# Patient Record
Sex: Male | Born: 1975
Health system: Southern US, Community
[De-identification: ages and names within clinical notes are randomized; demographics above are authoritative.]

## PROBLEM LIST (undated history)

## (undated) DIAGNOSIS — K219 Gastro-esophageal reflux disease without esophagitis: Secondary | ICD-10-CM

## (undated) DIAGNOSIS — F1020 Alcohol dependence, uncomplicated: Secondary | ICD-10-CM

## (undated) HISTORY — PX: TONSILLECTOMY: SUR1361

## (undated) HISTORY — PX: CERVICAL FUSION: SHX112

---

## 2005-01-14 ENCOUNTER — Ambulatory Visit (HOSPITAL_COMMUNITY): Admission: RE | Admit: 2005-01-14 | Discharge: 2005-01-14 | Payer: Self-pay | Admitting: Family Medicine

## 2008-03-21 ENCOUNTER — Ambulatory Visit (HOSPITAL_COMMUNITY): Admission: RE | Admit: 2008-03-21 | Discharge: 2008-03-21 | Payer: Self-pay | Admitting: Family Medicine

## 2010-05-25 ENCOUNTER — Ambulatory Visit (HOSPITAL_COMMUNITY): Admission: RE | Admit: 2010-05-25 | Discharge: 2010-05-25 | Payer: Self-pay | Admitting: Internal Medicine

## 2013-08-04 ENCOUNTER — Emergency Department (HOSPITAL_COMMUNITY)
Admission: EM | Admit: 2013-08-04 | Discharge: 2013-08-05 | Disposition: A | Discharge status: Home / Self Care | Payer: 59 | Attending: Emergency Medicine | Admitting: Emergency Medicine

## 2013-08-04 ENCOUNTER — Emergency Department (HOSPITAL_COMMUNITY): Payer: 59

## 2013-08-04 ENCOUNTER — Encounter (HOSPITAL_COMMUNITY): Payer: Self-pay | Admitting: Emergency Medicine

## 2013-08-04 DIAGNOSIS — Y9289 Other specified places as the place of occurrence of the external cause: Secondary | ICD-10-CM | POA: Insufficient documentation

## 2013-08-04 DIAGNOSIS — Y9339 Activity, other involving climbing, rappelling and jumping off: Secondary | ICD-10-CM | POA: Insufficient documentation

## 2013-08-04 DIAGNOSIS — S82899A Other fracture of unspecified lower leg, initial encounter for closed fracture: Secondary | ICD-10-CM | POA: Insufficient documentation

## 2013-08-04 DIAGNOSIS — S82892A Other fracture of left lower leg, initial encounter for closed fracture: Secondary | ICD-10-CM

## 2013-08-04 DIAGNOSIS — X500XXA Overexertion from strenuous movement or load, initial encounter: Secondary | ICD-10-CM | POA: Insufficient documentation

## 2013-08-04 DIAGNOSIS — S82839A Other fracture of upper and lower end of unspecified fibula, initial encounter for closed fracture: Secondary | ICD-10-CM

## 2013-08-04 NOTE — ED Provider Notes (Signed)
CSN: [id number]     Arrival date & time 08/04/13  2106 History  This chart was scribed for non-physician practitioner Antony Madura, PA-C working with Rolan Bucco, MD by Leone Payor, ED Scribe. This patient was seen in room WTR5/WTR5 and the patient's care was started at 2106.    Chief Complaint  Patient presents with  . Ankle Pain    The history is provided by the patient. No language interpreter was used.    HPI Comments: Antonio Allen is a 37 y.o. male who presents to the Emergency Department complaining of sudden onset, constant, non-radiating, unchanged left ankle pain that began earlier today. Pt states we was playing with his children when he jumped and landed on the pavement. He states his ankle gave out and he heard a snap. He describes the pain as pressure and discomfort. He has not taken any OTC pain medication PTA. He states hanging his foot down worsens the pain. Rest and elevation eases the pain. He denies numbness.    History reviewed. No pertinent past medical history. Past Surgical History  Procedure Laterality Date  . Tonsillectomy     History reviewed. No pertinent family history. History  Substance Use Topics  . Smoking status: Never Smoker   . Smokeless tobacco: Not on file  . Alcohol Use: Yes    Review of Systems  Musculoskeletal: Positive for arthralgias (left ankle pain).  Neurological: Negative for numbness.  All other systems reviewed and are negative.    Allergies  Review of patient's allergies indicates no known allergies.  Home Medications   Current Outpatient Rx  Name  Route  Sig  Dispense  Refill  . ibuprofen (ADVIL,MOTRIN) 200 MG tablet   Oral   Take 200 mg by mouth every 6 (six) hours as needed for pain.         Marland Kitchen oxyCODONE-acetaminophen (PERCOCET/ROXICET) 5-325 MG per tablet   Oral   Take 1-2 tablets by mouth every 6 (six) hours as needed for pain.   17 tablet   0    BP 125/68  Pulse 88  Temp(Src) 98 F (36.7 C) (Oral)   Resp 16  SpO2 100%  Physical Exam  Nursing note and vitals reviewed. Constitutional: He is oriented to person, place, and time. He appears well-developed and well-nourished. No distress.  HENT:  Head: Normocephalic and atraumatic.  Eyes: Conjunctivae and EOM are normal. No scleral icterus.  Neck: Normal range of motion.  Cardiovascular: Normal rate, regular rhythm and normal pulses.   Pulses:      Dorsalis pedis pulses are 2+ on the right side, and 2+ on the left side.       Posterior tibial pulses are 2+ on the right side, and 2+ on the left side.  Pulmonary/Chest: Effort normal. No respiratory distress.  Musculoskeletal: He exhibits tenderness.       Left ankle: He exhibits swelling and ecchymosis. He exhibits no deformity and normal pulse. Tenderness. Medial malleolus tenderness found. Achilles tendon normal.  Swelling and tenderness to palpation surrounding medial malleolus and extending on to the dorsal proximal left foot.   Neurological: He is alert and oriented to person, place, and time. He has normal reflexes. He displays normal reflexes. No sensory deficit.  DTR normal and symmetric.  Skin: Skin is warm and dry. No rash noted. He is not diaphoretic. No erythema. No pallor.  Psychiatric: He has a normal mood and affect. His behavior is normal.    ED Course  Procedures  DIAGNOSTIC STUDIES: Oxygen Saturation is 100% on RA, normal by my interpretation.    COORDINATION OF CARE: 10:11 PM Will order XRAY of left foot/ankle. Discussed treatment plan with pt at bedside and pt agreed to plan.    Labs Review Labs Reviewed - No data to display Imaging Review Dg Ankle Complete Left  08/04/2013   CLINICAL DATA:  Ankle pain  EXAM: LEFT ANKLE COMPLETE - 3+ VIEW  COMPARISON:  None.  FINDINGS: Oblique fracture through the distal fibula, in the coronal plane. The distal margin of the fracture is 3 cm above the talar dome. There is widening of the medial clear space, without medial  fracture. Posterior malleolar fracture with superior displacement of the fragment, up to 1 cm. The anterior ankle joint is widened.  Premature degeneration of the subtalar joint, likely posttraumatic. Prominent beaking of the anterior talus. No visible coalition.  IMPRESSION: 1. Distal fibular diaphysis fracture with medial ankle ligamentous injury and ankle mortise disruption. 2. Displaced posterior malleolar fracture.   Electronically Signed   By: Tiburcio Pea M.D.   On: 08/04/2013 22:37    EKG Interpretation   None       MDM   1. Fracture of distal fibula   2. Malleolar fracture, left, closed, initial encounter    Patient is a 37 year old male who presents for left ankle pain with onset this evening. Patient as well and nontoxic appearing, hemodynamically stable, and afebrile. Patient neurovascularly intact on physical exam, but with significant swelling to his left ankle; no obvious deformities appreciated. There is also no pallor, pulselessness, poikilothermia, or paresthesias. Xray significant for distal alar diaphysis fracture with medial ankle ligamentous injury and ankle mortise disruption area there is also a displaced posterior malleolar fracture. Have consulted with orthopedic surgery who recommended stable compression dressing with gauze and Kerlex followed by a posterior stirrup splint; will see for further evaluation as an outpatient. Patient instructed to be nonweightbearing and to use crutches when ambulating. RICE advised. Patient stable and appropriate for d/c with orthopedic follow up. Percocet prescribed for pain control. Patient agreeable to plan with no unaddressed concerns.  I personally performed the services described in this documentation, which was scribed in my presence. The recorded information has been reviewed and is accurate.    Antony Madura, PA-C 08/08/13 1725

## 2013-08-04 NOTE — ED Notes (Signed)
Bed: [facility] Expected date:  Expected time:  Means of arrival:  Comments: closed

## 2013-08-04 NOTE — ED Notes (Signed)
Moved pt to room 11 with wife

## 2013-08-04 NOTE — ED Notes (Signed)
Pt arrived to ED with a complaint of left ankle pain.  Pt was playing with children when he jumped and landed on the pavement when his ankle gave way and he heard a snap.  Pt can move his toes but is unable to move his foot laterally or up and down.

## 2013-08-05 MED ORDER — OXYCODONE-ACETAMINOPHEN 5-325 MG PO TABS: 1.0000 | ORAL_TABLET | Freq: Four times a day (QID) | ORAL | Status: DC | PRN

## 2013-08-06 ENCOUNTER — Encounter (HOSPITAL_COMMUNITY): Payer: Self-pay | Admitting: Pharmacy Technician

## 2013-08-07 ENCOUNTER — Encounter (HOSPITAL_COMMUNITY): Payer: Self-pay | Admitting: *Deleted

## 2013-08-07 NOTE — H&P (Signed)
Antonio Allen is an 37 y.o. male.   Chief Complaint: left ankle pain HPI: Antonio Allen is a 37 year old male who presented to Dr. Jeannetta Ellis office with the chief complaint of left ankle pain. He reports that he has been having pain in the left ankle since an injury on Saturday. He reports that he was throwing ball with his son at home when he jumped for the ball. When he landed, he rolled his ankle. He denies a history of significant injuries to the left ankle. He is not experiencing numbness or tingling in the legs. He has been resting and icing the ankle since the injury. AP, lateral, and oblique views were obtained initially at the ED on Saturday showing that he has a left ankle fracture.   Past Medical History GERD  Past Surgical History  Procedure Laterality Date  . Tonsillectomy     Family History Father: cancer  Social History:  reports that he has never smoked. He does not have any smokeless tobacco history on file. He reports that he drinks alcohol. He reports that he does not use illicit drugs.  Allergies: No Known Allergies   Current outpatient prescriptions: famotidine (PEPCID) 20 MG tablet, Take 20 mg by mouth daily., Disp: , Rfl: ;   ibuprofen (ADVIL,MOTRIN) 200 MG tablet, Take 200 mg by mouth every 6 (six) hours as needed for pain., Disp: , Rfl: ;   oxyCODONE-acetaminophen (PERCOCET/ROXICET) 5-325 MG per tablet, Take 1 tablet by mouth every 4 (four) hours as needed for pain., Disp: , Rfl:    Review of Systems  Constitutional: Negative.   HENT: Negative.   Eyes: Negative.   Respiratory: Negative.   Cardiovascular: Negative.   Gastrointestinal: Positive for heartburn. Negative for nausea, vomiting, abdominal pain, diarrhea, constipation, blood in stool and melena.  Genitourinary: Negative.   Musculoskeletal: Positive for falls and joint pain. Negative for back pain, myalgias and neck pain.       Left ankle pain  Skin: Negative.   Neurological: Negative.    Endo/Heme/Allergies: Negative.   Psychiatric/Behavioral: Negative.     Weight: 260 lb Height: 73 in Body Surface Area: 2.46 m Body Mass Index: 34.3 kg/m  Physical Exam  Constitutional: He is oriented to person, place, and time. He appears well-developed and well-nourished. No distress.  HENT:  Head: Normocephalic and atraumatic.  Right Ear: External ear normal.  Left Ear: External ear normal.  Nose: Nose normal.  Mouth/Throat: Oropharynx is clear and moist.  Eyes: EOM are normal.  Neck: Normal range of motion. Neck supple.  Cardiovascular: Normal rate, regular rhythm, normal heart sounds and intact distal pulses.   No murmur heard. Respiratory: Effort normal and breath sounds normal. No respiratory distress. He has no wheezes.  GI: Soft. Bowel sounds are normal. He exhibits no distension. There is no tenderness.  Musculoskeletal:       Right knee: Normal.       Left knee: Normal.       Right ankle: Normal.       Left ankle: He exhibits decreased range of motion, swelling and laceration. Tenderness. Lateral malleolus, medial malleolus and AITFL tenderness found. Achilles tendon normal.       Right lower leg: He exhibits no tenderness and no swelling.       Left lower leg: He exhibits swelling. He exhibits no tenderness and no edema.  Neurological: He is alert and oriented to person, place, and time. He has normal strength and normal reflexes. No sensory deficit.  Skin: No rash noted. He is not diaphoretic. No erythema.  Psychiatric: He has a normal mood and affect. His behavior is normal.     Assessment/Plan AP, lateral, and oblique views of the left ankle show distal fibular diaphysis fracture with medial ankle ligamentous injury and ankle mortise disruption and displaced posterior malleolar fracture. He needs an ORIF left ankle. This will be an overnight stay. Risks and benefits of the surgery discussed with the patient by Dr. Darrelyn Hillock.   Antonio Allen  Antonio Allen 08/07/2013, 2:07 PM

## 2013-08-08 ENCOUNTER — Encounter (HOSPITAL_COMMUNITY): Payer: 59 | Admitting: Anesthesiology

## 2013-08-08 ENCOUNTER — Ambulatory Visit (HOSPITAL_COMMUNITY): Payer: 59 | Admitting: Anesthesiology

## 2013-08-08 ENCOUNTER — Ambulatory Visit (HOSPITAL_COMMUNITY): Payer: 59

## 2013-08-08 ENCOUNTER — Encounter (HOSPITAL_COMMUNITY): Payer: Self-pay

## 2013-08-08 ENCOUNTER — Observation Stay (HOSPITAL_COMMUNITY)
Admission: RE | Admit: 2013-08-08 | Discharge: 2013-08-09 | Disposition: A | Discharge status: Home / Self Care | Payer: 59 | Source: Ambulatory Visit | Attending: Orthopedic Surgery | Admitting: Orthopedic Surgery

## 2013-08-08 ENCOUNTER — Encounter (HOSPITAL_COMMUNITY)
Admission: RE | Disposition: A | Discharge status: Home / Self Care | Payer: Self-pay | Source: Ambulatory Visit | Attending: Orthopedic Surgery

## 2013-08-08 DIAGNOSIS — K219 Gastro-esophageal reflux disease without esophagitis: Secondary | ICD-10-CM | POA: Insufficient documentation

## 2013-08-08 DIAGNOSIS — Y9379 Activity, other specified sports and athletics: Secondary | ICD-10-CM | POA: Insufficient documentation

## 2013-08-08 DIAGNOSIS — E669 Obesity, unspecified: Secondary | ICD-10-CM | POA: Insufficient documentation

## 2013-08-08 DIAGNOSIS — X500XXA Overexertion from strenuous movement or load, initial encounter: Secondary | ICD-10-CM | POA: Insufficient documentation

## 2013-08-08 DIAGNOSIS — S82842A Displaced bimalleolar fracture of left lower leg, initial encounter for closed fracture: Secondary | ICD-10-CM

## 2013-08-08 DIAGNOSIS — Y92009 Unspecified place in unspecified non-institutional (private) residence as the place of occurrence of the external cause: Secondary | ICD-10-CM | POA: Insufficient documentation

## 2013-08-08 DIAGNOSIS — S82843A Displaced bimalleolar fracture of unspecified lower leg, initial encounter for closed fracture: Principal | ICD-10-CM | POA: Insufficient documentation

## 2013-08-08 HISTORY — PX: ORIF ANKLE FRACTURE: SHX5408

## 2013-08-08 HISTORY — DX: Gastro-esophageal reflux disease without esophagitis: K21.9

## 2013-08-08 LAB — URINALYSIS, ROUTINE W REFLEX MICROSCOPIC
Bilirubin Urine: NEGATIVE
Glucose, UA: NEGATIVE mg/dL
Hgb urine dipstick: NEGATIVE
Ketones, ur: NEGATIVE mg/dL
Leukocytes, UA: NEGATIVE
Nitrite: NEGATIVE
Protein, ur: NEGATIVE mg/dL
Specific Gravity, Urine: 1.021 (ref 1.005–1.030)
Urobilinogen, UA: 0.2 mg/dL (ref 0.0–1.0)
pH: 7 (ref 5.0–8.0)

## 2013-08-08 LAB — CBC
HCT: 38.2 % — ABNORMAL LOW (ref 39.0–52.0)
Hemoglobin: 13.2 g/dL (ref 13.0–17.0)
MCH: 31.7 pg (ref 26.0–34.0)
MCHC: 34.6 g/dL (ref 30.0–36.0)
MCV: 91.8 fL (ref 78.0–100.0)
Platelets: 298 10*3/uL (ref 150–400)
RDW: 12.6 % (ref 11.5–15.5)

## 2013-08-08 LAB — PROTIME-INR
INR: 0.94 (ref 0.00–1.49)
Prothrombin Time: 12.4 seconds (ref 11.6–15.2)

## 2013-08-08 LAB — COMPREHENSIVE METABOLIC PANEL
ALT: 29 U/L (ref 0–53)
AST: 22 U/L (ref 0–37)
Albumin: 3.7 g/dL (ref 3.5–5.2)
Alkaline Phosphatase: 128 U/L — ABNORMAL HIGH (ref 39–117)
BUN: 13 mg/dL (ref 6–23)
CO2: 29 mEq/L (ref 19–32)
Calcium: 9.5 mg/dL (ref 8.4–10.5)
Chloride: 100 mEq/L (ref 96–112)
Creatinine, Ser: 0.99 mg/dL (ref 0.50–1.35)
GFR calc Af Amer: >90 mL/min (ref >90)
GFR calc non Af Amer: >90 mL/min (ref >90)
Glucose, Bld: 104 mg/dL — ABNORMAL HIGH (ref 70–99)
Potassium: 4.5 mEq/L (ref 3.5–5.1)
Sodium: 138 mEq/L (ref 135–145)
Total Bilirubin: 0.5 mg/dL (ref 0.3–1.2)
Total Protein: 7.3 g/dL (ref 6.0–8.3)

## 2013-08-08 LAB — APTT: aPTT: 28 seconds (ref 24–37)

## 2013-08-08 SURGERY — OPEN REDUCTION INTERNAL FIXATION (ORIF) ANKLE FRACTURE
Anesthesia: General | Site: Ankle | Laterality: Left | Wound class: Clean

## 2013-08-08 MED ORDER — CEFAZOLIN SODIUM-DEXTROSE 2-3 GM-% IV SOLR: 2.0000 g | INTRAVENOUS | Status: DC

## 2013-08-08 MED ORDER — FLEET ENEMA 7-19 GM/118ML RE ENEM: 1.0000 | ENEMA | Freq: Once | RECTAL | Status: AC | PRN

## 2013-08-08 MED ORDER — THROMBIN 5000 UNITS EX SOLR
CUTANEOUS | Status: AC
  Filled 2013-08-08: qty 10000

## 2013-08-08 MED ORDER — METHOCARBAMOL 500 MG PO TABS
500.0000 mg | ORAL_TABLET | Freq: Four times a day (QID) | ORAL | Status: DC | PRN
  Administered 2013-08-08 – 2013-08-09 (×2): 500 mg via ORAL
  Filled 2013-08-08 (×2): qty 1

## 2013-08-08 MED ORDER — PROPOFOL 10 MG/ML IV BOLUS
INTRAVENOUS | Status: DC | PRN
  Administered 2013-08-08: 200 mg via INTRAVENOUS

## 2013-08-08 MED ORDER — FAMOTIDINE 20 MG PO TABS
20.0000 mg | ORAL_TABLET | Freq: Every day | ORAL | Status: DC
  Filled 2013-08-08: qty 1

## 2013-08-08 MED ORDER — OXYCODONE-ACETAMINOPHEN 5-325 MG PO TABS
1.0000 | ORAL_TABLET | ORAL | Status: DC | PRN
  Administered 2013-08-08 – 2013-08-09 (×4): 2 via ORAL
  Filled 2013-08-08 (×4): qty 2

## 2013-08-08 MED ORDER — HYDROMORPHONE HCL PF 1 MG/ML IJ SOLN
INTRAMUSCULAR | Status: AC
  Filled 2013-08-08: qty 1

## 2013-08-08 MED ORDER — HYDROCODONE-ACETAMINOPHEN 5-325 MG PO TABS: 1.0000 | ORAL_TABLET | ORAL | Status: DC | PRN

## 2013-08-08 MED ORDER — ONDANSETRON HCL 4 MG PO TABS: 4.0000 mg | ORAL_TABLET | Freq: Four times a day (QID) | ORAL | Status: DC | PRN

## 2013-08-08 MED ORDER — LACTATED RINGERS IV SOLN
INTRAVENOUS | Status: DC
Start: 2013-08-08 — End: 2013-08-08
  Administered 2013-08-08: 1000 mL via INTRAVENOUS

## 2013-08-08 MED ORDER — BACITRACIN ZINC 500 UNIT/GM EX OINT
TOPICAL_OINTMENT | CUTANEOUS | Status: DC | PRN
  Administered 2013-08-08: 1 via TOPICAL

## 2013-08-08 MED ORDER — ONDANSETRON HCL 4 MG/2ML IJ SOLN: 4.0000 mg | Freq: Four times a day (QID) | INTRAMUSCULAR | Status: DC | PRN

## 2013-08-08 MED ORDER — PROMETHAZINE HCL 25 MG/ML IJ SOLN: 6.2500 mg | INTRAMUSCULAR | Status: DC | PRN

## 2013-08-08 MED ORDER — INFLUENZA VAC SPLIT QUAD 0.5 ML IM SUSP
0.5000 mL | INTRAMUSCULAR | Status: AC
  Administered 2013-08-09: 0.5 mL via INTRAMUSCULAR
  Filled 2013-08-08 (×2): qty 0.5

## 2013-08-08 MED ORDER — SODIUM CHLORIDE 0.9 % IR SOLN
Status: DC | PRN
  Administered 2013-08-08: 14:00:00

## 2013-08-08 MED ORDER — FENTANYL CITRATE 0.05 MG/ML IJ SOLN
INTRAMUSCULAR | Status: DC | PRN
  Administered 2013-08-08 (×2): 100 ug via INTRAVENOUS
  Administered 2013-08-08: 50 ug via INTRAVENOUS

## 2013-08-08 MED ORDER — DEXAMETHASONE SODIUM PHOSPHATE 10 MG/ML IJ SOLN
INTRAMUSCULAR | Status: DC | PRN
  Administered 2013-08-08: 10 mg via INTRAVENOUS

## 2013-08-08 MED ORDER — HYDROMORPHONE HCL PF 1 MG/ML IJ SOLN
0.5000 mg | INTRAMUSCULAR | Status: DC | PRN
  Administered 2013-08-08 – 2013-08-09 (×3): 1 mg via INTRAVENOUS
  Filled 2013-08-08 (×3): qty 1

## 2013-08-08 MED ORDER — SUCCINYLCHOLINE CHLORIDE 20 MG/ML IJ SOLN
INTRAMUSCULAR | Status: DC | PRN
  Administered 2013-08-08: 100 mg via INTRAVENOUS

## 2013-08-08 MED ORDER — MIDAZOLAM HCL 2 MG/2ML IJ SOLN
INTRAMUSCULAR | Status: DC | PRN
  Administered 2013-08-08: 2 mg via INTRAVENOUS

## 2013-08-08 MED ORDER — POLYETHYLENE GLYCOL 3350 17 G PO PACK
17.0000 g | PACK | Freq: Every day | ORAL | Status: DC | PRN
  Administered 2013-08-08 – 2013-08-09 (×2): 17 g via ORAL

## 2013-08-08 MED ORDER — HYDROMORPHONE HCL PF 1 MG/ML IJ SOLN
0.2500 mg | INTRAMUSCULAR | Status: DC | PRN
  Administered 2013-08-08 (×3): 0.5 mg via INTRAVENOUS

## 2013-08-08 MED ORDER — ENOXAPARIN SODIUM 30 MG/0.3ML ~~LOC~~ SOLN
30.0000 mg | SUBCUTANEOUS | Status: DC
  Administered 2013-08-09: 07:00:00 30 mg via SUBCUTANEOUS
  Filled 2013-08-08 (×2): qty 0.3

## 2013-08-08 MED ORDER — LACTATED RINGERS IV SOLN
INTRAVENOUS | Status: DC
  Administered 2013-08-09: 04:00:00 via INTRAVENOUS

## 2013-08-08 MED ORDER — DEXTROSE 5 % IV SOLN
500.0000 mg | Freq: Four times a day (QID) | INTRAVENOUS | Status: DC | PRN
  Administered 2013-08-08: 500 mg via INTRAVENOUS
  Filled 2013-08-08: qty 5

## 2013-08-08 MED ORDER — KETAMINE HCL 10 MG/ML IJ SOLN
INTRAMUSCULAR | Status: DC | PRN
  Administered 2013-08-08: 20 mg via INTRAVENOUS

## 2013-08-08 MED ORDER — BUPIVACAINE LIPOSOME 1.3 % IJ SUSP
20.0000 mL | Freq: Once | INTRAMUSCULAR | Status: AC
  Administered 2013-08-08: 15 mL
  Filled 2013-08-08: qty 20

## 2013-08-08 MED ORDER — BISACODYL 10 MG RE SUPP: 10.0000 mg | Freq: Every day | RECTAL | Status: DC | PRN

## 2013-08-08 MED ORDER — CEFAZOLIN SODIUM 1-5 GM-% IV SOLN
1.0000 g | Freq: Four times a day (QID) | INTRAVENOUS | Status: AC
  Administered 2013-08-08 – 2013-08-09 (×3): 1 g via INTRAVENOUS
  Filled 2013-08-08 (×3): qty 50

## 2013-08-08 MED ORDER — DEXTROSE 5 % IV SOLN
3.0000 g | INTRAVENOUS | Status: AC
  Administered 2013-08-08: 3 g via INTRAVENOUS
  Filled 2013-08-08: qty 3000

## 2013-08-08 MED ORDER — LIDOCAINE HCL (CARDIAC) 20 MG/ML IV SOLN
INTRAVENOUS | Status: DC | PRN
  Administered 2013-08-08: 80 mg via INTRAVENOUS

## 2013-08-08 MED ORDER — BACITRACIN-NEOMYCIN-POLYMYXIN 400-5-5000 EX OINT
TOPICAL_OINTMENT | CUTANEOUS | Status: AC
  Filled 2013-08-08: qty 1

## 2013-08-08 SURGICAL SUPPLY — 55 items
BAG SPEC THK2 15X12 ZIP CLS (MISCELLANEOUS) ×1
BAG ZIPLOCK 12X15 (MISCELLANEOUS) ×2 IMPLANT
BANDAGE ELASTIC 4 VELCRO ST LF (GAUZE/BANDAGES/DRESSINGS) ×2 IMPLANT
BANDAGE ELASTIC 6 VELCRO ST LF (GAUZE/BANDAGES/DRESSINGS) ×2 IMPLANT
BANDAGE GAUZE ELAST BULKY 4 IN (GAUZE/BANDAGES/DRESSINGS) ×2 IMPLANT
BIT DRILL 2.5X2.75 QC CALB (BIT) ×1 IMPLANT
BIT DRILL 3.5X5.5 QC CALB (BIT) ×1 IMPLANT
BNDG PLASTER X FAST 3X3 WHT LF (CAST SUPPLIES) ×1 IMPLANT
BNDG PLSTR 9X3 FST ST WHT (CAST SUPPLIES) ×1
CLOTH BEACON ORANGE TIMEOUT ST (SAFETY) ×1 IMPLANT
CUFF TOURN SGL QUICK 34 (TOURNIQUET CUFF)
CUFF TRNQT CYL 34X4X40X1 (TOURNIQUET CUFF) ×1 IMPLANT
DRAPE C-ARM 42X120 X-RAY (DRAPES) ×2 IMPLANT
DRAPE C-ARMOR (DRAPES) ×1 IMPLANT
DRAPE U-SHAPE 47X51 STRL (DRAPES) ×2 IMPLANT
DRSG ADAPTIC 3X8 NADH LF (GAUZE/BANDAGES/DRESSINGS) ×2 IMPLANT
DRSG PAD ABDOMINAL 8X10 ST (GAUZE/BANDAGES/DRESSINGS) ×3 IMPLANT
DURAPREP 26ML APPLICATOR (WOUND CARE) ×2 IMPLANT
ELECT REM PT RETURN 9FT ADLT (ELECTROSURGICAL) ×2
ELECTRODE REM PT RTRN 9FT ADLT (ELECTROSURGICAL) ×1 IMPLANT
GLOVE BIOGEL PI IND STRL 8 (GLOVE) ×1 IMPLANT
GLOVE BIOGEL PI INDICATOR 8 (GLOVE) ×1
GLOVE ECLIPSE 8.0 STRL XLNG CF (GLOVE) ×4 IMPLANT
GOWN PREVENTION PLUS LG XLONG (DISPOSABLE) ×6 IMPLANT
GOWN STRL REIN XL XLG (GOWN DISPOSABLE) ×4 IMPLANT
MANIFOLD NEPTUNE II (INSTRUMENTS) ×2 IMPLANT
PACK LOWER EXTREMITY WL (CUSTOM PROCEDURE TRAY) ×2 IMPLANT
PAD CAST 4YDX4 CTTN HI CHSV (CAST SUPPLIES) ×2 IMPLANT
PADDING CAST COTTON 4X4 STRL (CAST SUPPLIES) ×2
PADDING CAST COTTON 6X4 STRL (CAST SUPPLIES) ×4 IMPLANT
PLATE ACE 100DEG 8HOLE (Plate) ×1 IMPLANT
POSITIONER SURGICAL ARM (MISCELLANEOUS) ×1 IMPLANT
SCOTCHCAST PLUS 3X4 WHITE (CAST SUPPLIES) ×3 IMPLANT
SCREW CORTICAL 3.5MM  12MM (Screw) ×1 IMPLANT
SCREW CORTICAL 3.5MM  16MM (Screw) ×1 IMPLANT
SCREW CORTICAL 3.5MM  20MM (Screw) ×1 IMPLANT
SCREW CORTICAL 3.5MM  60MM (Screw) ×1 IMPLANT
SCREW CORTICAL 3.5MM 12MM (Screw) IMPLANT
SCREW CORTICAL 3.5MM 14MM (Screw) ×3 IMPLANT
SCREW CORTICAL 3.5MM 16MM (Screw) IMPLANT
SCREW CORTICAL 3.5MM 20MM (Screw) IMPLANT
SCREW CORTICAL 3.5MM 22MM (Screw) ×1 IMPLANT
SCREW CORTICAL 3.5MM 60MM (Screw) IMPLANT
SCREW NLOCK CANC HEX 4X16 (Screw) ×2 IMPLANT
SPONGE GAUZE 4X4 12PLY (GAUZE/BANDAGES/DRESSINGS) ×4 IMPLANT
SPONGE LAP 4X18 X RAY DECT (DISPOSABLE) ×2 IMPLANT
STAPLER VISISTAT 35W (STAPLE) ×1 IMPLANT
SUT ETHIBOND NAB CT1 #1 30IN (SUTURE) ×1 IMPLANT
SUT VIC AB 0 CT1 27 (SUTURE)
SUT VIC AB 0 CT1 27XBRD ANTBC (SUTURE) ×1 IMPLANT
SUT VIC AB 1 CT1 27 (SUTURE) ×4
SUT VIC AB 1 CT1 27XBRD ANTBC (SUTURE) IMPLANT
SUT VIC AB 2-0 CT1 27 (SUTURE) ×2
SUT VIC AB 2-0 CT1 TAPERPNT 27 (SUTURE) ×1 IMPLANT
TOWEL OR 17X26 10 PK STRL BLUE (TOWEL DISPOSABLE) ×4 IMPLANT

## 2013-08-08 NOTE — Interval H&P Note (Signed)
History and Physical Interval Note:  08/08/2013 12:13 PM  Antonio Allen  has presented today for surgery, with the diagnosis of left ankle fracture  The various methods of treatment have been discussed with the patient and family. After consideration of risks, benefits and other options for treatment, the patient has consented to  Procedure(s): OPEN REDUCTION INTERNAL FIXATION (ORIF) LEFT ANKLE FRACTURE (Left) as a surgical intervention .  The patient's history has been reviewed, patient examined, no change in status, stable for surgery.  I have reviewed the patient's chart and labs.  Questions were answered to the patient's satisfaction.     Geetika Laborde A

## 2013-08-08 NOTE — Anesthesia Postprocedure Evaluation (Signed)
  Anesthesia Post-op Note  Patient: Antonio Allen  Procedure(s) Performed: Procedure(s) (LRB): OPEN REDUCTION INTERNAL FIXATION (ORIF) LEFT ANKLE FRACTURE (Left)  Patient Location: PACU  Anesthesia Type: General  Level of Consciousness: awake and alert   Airway and Oxygen Therapy: Patient Spontanous Breathing  Post-op Pain: mild  Post-op Assessment: Post-op Vital signs reviewed, Patient's Cardiovascular Status Stable, Respiratory Function Stable, Patent Airway and No signs of Nausea or vomiting  Last Vitals:  Filed Vitals:   08/08/13 1545  BP: 131/75  Pulse: 90  Temp:   Resp: 10    Post-op Vital Signs: stable   Complications: No apparent anesthesia complications

## 2013-08-08 NOTE — Brief Op Note (Signed)
08/08/2013  3:09 PM  PATIENT:  Antonio Allen  37 y.o. male  PRE-OPERATIVE DIAGNOSIS:  left ankle fracture,Bimalleolar Ankle Fracture with disruption of ankle Mortise.  POST-OPERATIVE DIAGNOSIS:  left ankle fracture,Bimalleolar Ankle Fracture wtht disruption of ankle Mortise  PROCEDURE:  Procedure(s): OPEN REDUCTION INTERNAL FIXATION (ORIF) LEFT ANKLE FRACTURE (Left),Bimalleolar Fracture,With Syndesmosis Screw  SURGEON:  Surgeon(s) and Role:    * Jacki Cones, MD - Primary  PHYSICIAN ASSISTANT:Amber Fidd[place] PA   ASSISTANTS: Dimitri Ped PA   ANESTHESIA:   general  EBL:  Total I/O In: 1100 [I.V.:1100] Out: -   BLOOD ADMINISTERED:none  DRAINS: none   LOCAL MEDICATIONS USED:  BUPIVICAINE 20cc of Exparel.   SPECIMEN:  No Specimen  DISPOSITION OF SPECIMEN:  N/A  COUNTS:  YES  TOURNIQUET:   Total Tourniquet Time Documented: Thigh (Left) - 75 minutes Total: Thigh (Left) - 75 minutes   DICTATION: .Other Dictation: Dictation Number [id number]  PLAN OF CARE: Admit for overnight observation  PATIENT DISPOSITION:  PACU - hemodynamically stable.   Delay start of Pharmacological VTE agent (>24hrs) due to surgical blood loss or risk of bleeding: yes

## 2013-08-08 NOTE — Transfer of Care (Signed)
Immediate Anesthesia Transfer of Care Note  Patient: Antonio Allen  Procedure(s) Performed: Procedure(s): OPEN REDUCTION INTERNAL FIXATION (ORIF) LEFT ANKLE FRACTURE (Left)  Patient Location: PACU  Anesthesia Type:General  Level of Consciousness: sedated  Airway & Oxygen Therapy: Patient Spontanous Breathing and Patient connected to face mask oxygen  Post-op Assessment: Report given to PACU RN and Post -op Vital signs reviewed and stable  Post vital signs: Reviewed and stable  Complications: No apparent anesthesia complications

## 2013-08-08 NOTE — Anesthesia Preprocedure Evaluation (Signed)
Anesthesia Evaluation  Patient identified by MRN, date of birth, ID band Patient awake    Reviewed: Allergy & Precautions, H&P , NPO status , Patient's Chart, lab work & pertinent test results  Airway Mallampati: II TM Distance: >3 FB Neck ROM: Full    Dental no notable dental hx.    Pulmonary neg pulmonary ROS,  breath sounds clear to auscultation  Pulmonary exam normal       Cardiovascular Exercise Tolerance: Good negative cardio ROS  Rhythm:Regular Rate:Normal     Neuro/Psych negative neurological ROS  negative psych ROS   GI/Hepatic Neg liver ROS, GERD-  Medicated,  Endo/Other  negative endocrine ROS  Renal/GU negative Renal ROS  negative genitourinary   Musculoskeletal negative musculoskeletal ROS (+)   Abdominal (+) + obese,   Peds negative pediatric ROS (+)  Hematology negative hematology ROS (+)   Anesthesia Other Findings   Reproductive/Obstetrics negative OB ROS                           Anesthesia Physical Anesthesia Plan  ASA: II  Anesthesia Plan: General   Post-op Pain Management:    Induction: Intravenous  Airway Management Planned: Oral ETT  Additional Equipment:   Intra-op Plan:   Post-operative Plan: Extubation in OR  Informed Consent: I have reviewed the patients History and Physical, chart, labs and discussed the procedure including the risks, benefits and alternatives for the proposed anesthesia with the patient or authorized representative who has indicated his/her understanding and acceptance.   Dental advisory given  Plan Discussed with: CRNA  Anesthesia Plan Comments:         Anesthesia Quick Evaluation

## 2013-08-09 ENCOUNTER — Encounter (HOSPITAL_COMMUNITY): Payer: Self-pay | Admitting: Orthopedic Surgery

## 2013-08-09 MED ORDER — PNEUMOCOCCAL VAC POLYVALENT 25 MCG/0.5ML IJ INJ: 0.5000 mL | INJECTION | INTRAMUSCULAR | Status: DC

## 2013-08-09 MED ORDER — ENOXAPARIN SODIUM 30 MG/0.3ML ~~LOC~~ SOLN: 30.0000 mg | SUBCUTANEOUS | Status: DC

## 2013-08-09 MED ORDER — HYDROMORPHONE HCL 4 MG PO TABS: 4.0000 mg | ORAL_TABLET | ORAL | Status: DC | PRN

## 2013-08-09 NOTE — ED Provider Notes (Signed)
Medical screening examination/treatment/procedure(s) were performed by non-physician practitioner and as supervising physician I was immediately available for consultation/collaboration.  EKG Interpretation   None         Araya Roel, MD 08/09/13 0656 

## 2013-08-09 NOTE — Care Management Note (Signed)
    Page 1 of 1   08/09/2013     11:48:44 AM   CARE MANAGEMENT NOTE 08/09/2013  Patient:  Antonio Allen, Antonio Allen   Account Number:  [id number]  Date Initiated:  08/09/2013  Documentation initiated by:  Colleen Can  Subjective/Objective Assessment:   DX LEFT ANKLE FRACTURE-DISPLACED; ORIF     Action/Plan:   CM spoke with patient and spouse. Plans are for patient to return to his home where spouse will be caregiver. He alrwady has crutches. There are no HH needs.   Anticipated DC Date:  08/09/2013   Anticipated DC Plan:  HOME/SELF CARE      DC Planning Services  CM consult      Choice offered to / List presented to:             Status of service:  Completed, signed off Medicare Important Message given?   (If response is "NO", the following Medicare IM given date fields will be blank) Date Medicare IM given:   Date Additional Medicare IM given:    Discharge Disposition:  HOME/SELF CARE  Per UR Regulation:  Reviewed for med. necessity/level of care/duration of stay  If discussed at Long Length of Stay Meetings, dates discussed:    Comments:

## 2013-08-09 NOTE — Op Note (Signed)
NAMESEGER, Antonio Allen NO.:  [id number]  MEDICAL RECORD NO.:  [id number]  LOCATION:  1617                         FACILITY:  [facility]  PHYSICIAN:  Georges Lynch. Netta Fodge, M.D.DATE OF BIRTH:  1976/05/02  DATE OF PROCEDURE: DATE OF DISCHARGE:                              OPERATIVE REPORT   SURGEON:  Lei Dower A. Darrelyn Hillock, M.D.  OPERATIVE ASSISTANT:  [employer] PA.  Note, the posterior fragment of the posterior malleolus was less than 25% of the articular surface.  There was some comminution in the fragment as well.  The fibula was significantly displaced.  The ankle mortise was widened as well.  PREOPERATIVE DIAGNOSIS:  Closed displaced bimalleolar ankle fracture on the left.  POSTOPERATIVE DIAGNOSIS:  Closed displaced bimalleolar ankle fracture on the left.  OPERATION:  Open reduction internal fixation of the fibular shaft utilizing a plate with screws and also I utilized 1 syndesmosis screw across the syndesmosis of the distal tibia and fibula as well.  PROCEDURE IN DETAIL:  Under general anesthesia, routine orthopedic prep followed by a sterile prep of the left leg was carried out.  He had 2 g of IV Ancef preop.  At this time, the appropriate time-out was carried out first, also marked the appropriate left leg in the holding area. The patient was positioned in a supine position.  Tourniquet was applied.  It was elevated at 325 mmHg.  Note, great care was taken not to injure the sural nerve, or the superficial peroneal nerve.  An incision was made over the lateral aspect of the fibula on the left. Bleeders were identified and cauterized.  Once again, great care was taken not to disturb the underlying sensory nerve.  I went right down directly onto the fibula.  I explored the peroneal tendon then the fibular fracture.  Also noted some widening of the ankle mortise.  At this time, we utilized a bone clamp to clamp a 1/3 fibular tibial plate on and then x-ray was  taken to make sure we were distal enough with the plate we worked.  At this time, I utilized 2 screws for fixation of the proximal fibular plate.  I then utilized 1 cancellous screw distally, followed by second cancellous screw distally to hold the fibular plate in place.  I then utilized a transverse syndesmosis screw.  C-arm was used to verify position of the screw.  We measured the screw to be a 60 mm in length screw.  At this time, remaining screws then were placed into the fibula unit.  These were nonlocking screws.  X-ray was taken to make sure we had the appropriate screw lengths.  We had an excellent reduction of the ankle mortise.  The lateral view was taken with the foot dorsiflexed and I felt we had good approximation of that small posterior malleolar fracture fragment.  After this was done, we thoroughly irrigated out the wound site, closed the wound site in usual fashion.  We injected 15 mL of Exparel into the fracture site, and then into the soft tissue.  I then closed the skin with metal staples.  Sterile Neosporin dressing was applied.  Note, we put a good bundle dressing underneath  and then loosely applied short leg cast.  The patient did have 2 g of IV Ancef.  He will be admitted overnight.          ______________________________ Georges Lynch. Darrelyn Hillock, M.D.     RAG/MEDQ  D:  08/08/2013  T:  08/09/2013  Job:  [id number]

## 2013-08-09 NOTE — Progress Notes (Signed)
Subjective: 1 Day Post-Op Procedure(s) (LRB): OPEN REDUCTION INTERNAL FIXATION (ORIF) LEFT ANKLE FRACTURE (Left) Patient reports pain as 4 on 0-10 scale. Doing Well today. Circulation Intact in Foot.   Objective: Vital signs in last 24 hours: Temp:  [97.5 F (36.4 C)-98.8 F (37.1 C)] 98.8 F (37.1 C) (10/23 0516) Pulse Rate:  [73-100] 93 (10/23 0516) Resp:  [10-18] 16 (10/23 0516) BP: (124-143)/(62-81) 129/77 mmHg (10/23 0516) SpO2:  [97 %-100 %] 98 % (10/23 0200) Weight:  [123.605 kg (272 lb 8 oz)] 123.605 kg (272 lb 8 oz) (10/22 0921)  Intake/Output from previous day: 10/22 0701 - 10/23 0700 In: 3366.7 [P.O.:720; I.V.:2646.7] Out: 2875 [Urine:2875] Intake/Output this shift:     Recent Labs  08/08/13 1700  HGB 13.2    Recent Labs  08/08/13 1700  WBC 6.8  RBC 4.16*  HCT 38.2*  PLT 298    Recent Labs  08/08/13 1000  NA 138  K 4.5  CL 100  CO2 29  BUN 13  CREATININE 0.99  GLUCOSE 104*  CALCIUM 9.5    Recent Labs  08/08/13 1000  INR 0.94    Neurovascular intact  Assessment/Plan: 1 Day Post-Op Procedure(s) (LRB): OPEN REDUCTION INTERNAL FIXATION (ORIF) LEFT ANKLE FRACTURE (Left) Discharge home with home health  Fredi Hurtado A 08/09/2013, 7:41 AM

## 2013-08-09 NOTE — Discharge Summary (Signed)
Physician Discharge Summary  Patient ID: Antonio Allen MRN: [id number] DOB/AGE: 01/17/1976 37 y.o.  Admit date: 08/08/2013 Discharge date: 08/09/2013  Admission Diagnoses:Closed BiMalleolar Ankle Fracture on the Left  Discharge Diagnoses: Closed BiMalleolar Ankle Fracture on the Left Active Problems:   Closed bimalleolar fracture of left ankle   Discharged Condition: good  Hospital Course: No Post-Op Problems  Consults: rehabilitation medicine  Significant Diagnostic Studies: Xrays  Treatments: antibiotics: Ancef and anticoagulation: Lovenox  Discharge Exam: Blood pressure 112/70, pulse 75, temperature 98.3 F (36.8 C), temperature source Oral, resp. rate 14, height 6\' 1"  (1.854 m), weight 123.605 kg (272 lb 8 oz), SpO2 99.00%. Extremities: Good circulation in Foot.  Disposition: 01-Home or Self Care  Discharge Orders   Future Orders Complete By Expires   Call MD / Call 911  As directed    Comments:     If you experience chest pain or shortness of breath, CALL 911 and be transported to the hospital emergency room.  If you develope a fever above 101 F, pus (white drainage) or increased drainage or redness at the wound, or calf pain, call your surgeon's office.   Constipation Prevention  As directed    Comments:     Drink plenty of fluids.  Prune juice may be helpful.  You may use a stool softener, such as Colace (over the counter) 100 mg twice a day.  Use MiraLax (over the counter) for constipation as needed.   Diet - low sodium heart healthy  As directed    Discharge instructions  As directed    Comments:     Ice and elevate lrft leg at home. Ambulate non-weight bearing with crutches.See Dr. Darrelyn Hillock next Tuesday.   Driving restrictions  As directed    Comments:     No driving for two weeks   Increase activity slowly as tolerated  As directed        Medication List    STOP taking these medications       aspirin 325 MG tablet     diphenhydrAMINE 25 MG tablet   Commonly known as:  BENADRYL     ibuprofen 200 MG tablet  Commonly known as:  ADVIL,MOTRIN     oxyCODONE-acetaminophen 5-325 MG per tablet  Commonly known as:  PERCOCET/ROXICET      TAKE these medications       enoxaparin 30 MG/0.3ML injection  Commonly known as:  LOVENOX  Inject 0.3 mLs (30 mg total) into the skin daily.     famotidine 20 MG tablet  Commonly known as:  PEPCID  Take 20 mg by mouth daily.     HYDROmorphone 4 MG tablet  Commonly known as:  DILAUDID  Take 1 tablet (4 mg total) by mouth every 4 (four) hours as needed for pain.         Signed: Jibreel Fedewa A 08/09/2013, 10:47 PM

## 2013-08-09 NOTE — Evaluation (Signed)
Physical Therapy Evaluation Patient Details Name: Antonio Allen MRN: [id number] DOB: 12-02-1975 Today's Date: 08/09/2013 Time:  -     PT Assessment / Plan / Recommendation History of Present Illness  ORIF L ankle  Clinical Impression  Pt doing well, has been using crutches since Saturday; stairs deferred    PT Assessment  Patent does not need any further PT services    Follow Up Recommendations  No PT follow up    Does the patient have the potential to tolerate intense rehabilitation      Barriers to Discharge        Equipment Recommendations       Recommendations for Other Services     Frequency      Precautions / Restrictions Restrictions Weight Bearing Restrictions: Yes LLE Weight Bearing: Non weight bearing   Pertinent Vitals/Pain No c/o pain      Mobility  Bed Mobility Bed Mobility: Supine to Sit Supine to Sit: 5: Supervision Details for Bed Mobility Assistance: for safety Transfers Transfers: Sit to Stand;Stand to Sit Sit to Stand: 5: Supervision;6: Modified independent (Device/Increase time) Stand to Sit: 5: Supervision;6: Modified independent (Device/Increase time)    Exercises     PT Diagnosis:    PT Problem List:   PT Treatment Interventions:       PT Goals(Current goals can be found in the care plan section)    Visit Information  Last PT Received On: 08/09/13 Assistance Needed: +1 History of Present Illness: ORIF L ankle       Prior Functioning  Home Living Family/patient expects to be discharged to:: Private residence Living Arrangements: Spouse/significant other;Children Type of Home: House Home Access: Stairs to enter Secretary/administrator of Steps: 2 Home Layout: One level Home Equipment: Crutches Additional Comments: pt on crutches since Saturday Prior Function Level of Independence: Independent Communication Communication: No difficulties    Cognition  Cognition Arousal/Alertness: Awake/alert Behavior During  Therapy: WFL for tasks assessed/performed Overall Cognitive Status: Within Functional Limits for tasks assessed    Extremity/Trunk Assessment Upper Extremity Assessment Upper Extremity Assessment: Overall WFL for tasks assessed Lower Extremity Assessment Lower Extremity Assessment: LLE deficits/detail LLE Deficits / Details: knee and hip AROM grossly WFL LLE: Unable to fully assess due to immobilization   Balance Static Standing Balance Static Standing - Balance Support: During functional activity Static Standing - Level of Assistance: 6: Modified independent (Device/Increase time)  End of Session PT - End of Session Equipment Utilized During Treatment: Gait belt Activity Tolerance: Patient tolerated treatment well Patient left: in chair;with call bell/phone within reach;with family/visitor present  GP     [facility] 08/09/2013, 11:29 AM

## 2014-12-03 ENCOUNTER — Ambulatory Visit (INDEPENDENT_AMBULATORY_CARE_PROVIDER_SITE_OTHER): Payer: 59 | Admitting: Physician Assistant

## 2014-12-03 VITALS — BP 116/78 | HR 84 | Temp 97.7°F | Resp 16 | Ht 73.5 in | Wt 291.0 lb

## 2014-12-03 DIAGNOSIS — H6983 Other specified disorders of Eustachian tube, bilateral: Secondary | ICD-10-CM

## 2014-12-03 DIAGNOSIS — H6121 Impacted cerumen, right ear: Secondary | ICD-10-CM

## 2014-12-03 MED ORDER — TRIAMCINOLONE ACETONIDE 55 MCG/ACT NA AERO: 2.0000 | INHALATION_SPRAY | Freq: Every day | NASAL | Status: DC

## 2014-12-03 NOTE — Progress Notes (Signed)
   Subjective:    Patient ID: Antonio Allen, male    DOB: July 26, 1976, 38 y.o.   MRN: [id number]  HPI Pt presents to clinic with intermittent hearing loss and his equilibrium has felt off over the last about 2 weeks.  It feels like he is up on a mountain and his ears are clogged.  He has not had problems with cold symptoms recently and he does not currently have nasal congestion.  He has tubes as a child but as an adult he has not had problems with his ears.  He is having intermittent tinnitus and intermittent though not associated dizzy but not vertigo.  He has done nothing for his ears but take a zyrtec pill once that did not help.  Review of Systems  Constitutional: Negative for fever and chills.  HENT: Positive for hearing loss. Negative for congestion, ear discharge, ear pain and sinus pressure.   Respiratory: Negative for cough.        Objective:   Physical Exam  Constitutional: He is oriented to person, place, and time. He appears well-developed and well-nourished.  BP 116/78 mmHg  Pulse 84  Temp(Src) 97.7 F (36.5 C) (Oral)  Resp 16  Ht 6' 1.5" (1.867 m)  Wt 291 lb (131.997 kg)  BMI 37.87 kg/m2  SpO2 97%   HENT:  Head: Normocephalic and atraumatic.  Right Ear: Hearing and external ear normal. A foreign body (cerumen impaction - removed with lavage) is present. Tympanic membrane is scarred and retracted. Tympanic membrane is not erythematous.  Left Ear: Hearing, external ear and ear canal normal. Tympanic membrane is scarred and retracted. Tympanic membrane is not erythematous.  Nose: Mucosal edema (red, swollen) present.  Mouth/Throat: Uvula is midline, oropharynx is clear and moist and mucous membranes are normal.  Cardiovascular: Normal rate, regular rhythm and normal heart sounds.   No murmur heard. Pulmonary/Chest: Effort normal and breath sounds normal.  Neurological: He is alert and oriented to person, place, and time.  Skin: Skin is warm and dry.  Psychiatric: He  has a normal mood and affect. His behavior is normal. Judgment and thought content normal.       Assessment & Plan:  ETD (eustachian tube dysfunction), bilateral - Plan: triamcinolone (NASACORT AQ) 55 MCG/ACT AERO nasal inhaler  Cerumen impaction, right   Symptomatic treatment - d/w pt that this make take a few weeks along with Zyrtec D - he will f/u if he has problems.  Windell Hummingbird PA-C  [facility] 12/03/2014 5:33 PM

## 2014-12-03 NOTE — Progress Notes (Signed)
  Medical screening examination/treatment/procedure(s) were performed by non-physician practitioner and as supervising physician I was immediately available for consultation/collaboration.     

## 2014-12-03 NOTE — Patient Instructions (Signed)
Zyrtec D to help with congestion Nasal spray to help with nasal congestion that you may not be aware of

## 2015-04-10 ENCOUNTER — Other Ambulatory Visit (INDEPENDENT_AMBULATORY_CARE_PROVIDER_SITE_OTHER): Payer: Self-pay | Admitting: Otolaryngology

## 2015-04-10 DIAGNOSIS — H918X9 Other specified hearing loss, unspecified ear: Secondary | ICD-10-CM

## 2015-04-11 ENCOUNTER — Other Ambulatory Visit (INDEPENDENT_AMBULATORY_CARE_PROVIDER_SITE_OTHER): Payer: Self-pay | Admitting: Otolaryngology

## 2015-04-11 DIAGNOSIS — H918X9 Other specified hearing loss, unspecified ear: Secondary | ICD-10-CM

## 2015-04-14 ENCOUNTER — Other Ambulatory Visit: Payer: 59

## 2015-04-23 ENCOUNTER — Inpatient Hospital Stay: Admission: RE | Admit: 2015-04-23 | Payer: 59 | Source: Ambulatory Visit

## 2015-04-23 ENCOUNTER — Other Ambulatory Visit: Payer: 59

## 2018-05-30 DIAGNOSIS — D229 Melanocytic nevi, unspecified: Secondary | ICD-10-CM

## 2018-05-30 HISTORY — DX: Melanocytic nevi, unspecified: D22.9

## 2020-01-28 ENCOUNTER — Ambulatory Visit: Admission: EM | Admit: 2020-01-28 | Discharge: 2020-01-28 | Discharge status: Against Medical Advice | Payer: 59

## 2020-01-28 ENCOUNTER — Ambulatory Visit
Admission: EM | Admit: 2020-01-28 | Discharge: 2020-01-28 | Disposition: A | Discharge status: Home / Self Care | Payer: No Typology Code available for payment source

## 2020-01-28 ENCOUNTER — Other Ambulatory Visit: Payer: Self-pay

## 2020-03-10 ENCOUNTER — Ambulatory Visit (INDEPENDENT_AMBULATORY_CARE_PROVIDER_SITE_OTHER): Payer: No Typology Code available for payment source | Admitting: Dermatology

## 2020-03-10 ENCOUNTER — Other Ambulatory Visit: Payer: Self-pay

## 2020-03-10 ENCOUNTER — Encounter: Payer: Self-pay | Admitting: Dermatology

## 2020-03-10 DIAGNOSIS — L821 Other seborrheic keratosis: Secondary | ICD-10-CM

## 2020-03-10 DIAGNOSIS — I781 Nevus, non-neoplastic: Secondary | ICD-10-CM

## 2020-03-10 DIAGNOSIS — D229 Melanocytic nevi, unspecified: Secondary | ICD-10-CM

## 2020-03-10 DIAGNOSIS — Z1283 Encounter for screening for malignant neoplasm of skin: Secondary | ICD-10-CM | POA: Diagnosis not present

## 2020-03-10 DIAGNOSIS — D485 Neoplasm of uncertain behavior of skin: Secondary | ICD-10-CM | POA: Diagnosis not present

## 2020-03-10 DIAGNOSIS — D225 Melanocytic nevi of trunk: Secondary | ICD-10-CM | POA: Diagnosis not present

## 2020-03-10 NOTE — Patient Instructions (Signed)

## 2020-03-10 NOTE — Progress Notes (Signed)
WIFE WITH PATIENT

## 2020-03-16 ENCOUNTER — Encounter: Payer: Self-pay | Admitting: Dermatology

## 2020-03-16 NOTE — Progress Notes (Signed)
   Follow-Up Visit   Subjective  Antonio Allen is a 44 y.o. male who presents for the following: Skin Problem (X MONTHS RIGHT CHEEK).  Growth Location: Right cheek Duration: Avril months Quality: Larger Associated Signs/Symptoms: Needs Modifying Factors:  Severity:  Timing: Context: Street of atypical moles  The following portions of the chart were reviewed this encounter and updated as appropriate: Tobacco  Allergies  Meds  Problems  Med Hx  Surg Hx  Fam Hx      Objective  Well appearing patient in no apparent distress; mood and affect are within normal limits.  All skin waist up examined.   Assessment & Plan  Neoplasm of uncertain behavior of skin Right Parotid Area  Skin / nail biopsy Type of biopsy: tangential   Informed consent: discussed and consent obtained   Timeout: patient name, date of birth, surgical site, and procedure verified   Procedure prep:  Patient was prepped and draped in usual sterile fashion Prep type:  Chlorhexidine Anesthesia: the lesion was anesthetized in a standard fashion   Anesthetic:  1% lidocaine w/ epinephrine 1-100,000 local infiltration Instrument used: flexible razor blade   Hemostasis achieved with: ferric subsulfate   Outcome: patient tolerated procedure well   Post-procedure details: wound care instructions given    Specimen 1 - Surgical pathology Differential Diagnosis: BCC SCC Check Margins: No  Base curetted and cauterized  Nevus Mid Back  Seborrheic keratosis (5) Right Elbow - Posterior; Right Forearm - Posterior; Right Lower Back; Left Temple; Right Temple  Reassure  Spider angioma (2) Right Breast; Left Abdomen (side) - Upper  Reassure  Skin cancer screening performed today.

## 2020-04-05 ENCOUNTER — Encounter (HOSPITAL_COMMUNITY): Payer: Self-pay | Admitting: Emergency Medicine

## 2020-04-05 ENCOUNTER — Emergency Department (HOSPITAL_COMMUNITY)
Admission: EM | Admit: 2020-04-05 | Discharge: 2020-04-05 | Disposition: A | Discharge status: Home / Self Care | Payer: No Typology Code available for payment source | Attending: Emergency Medicine | Admitting: Emergency Medicine

## 2020-04-05 ENCOUNTER — Emergency Department (HOSPITAL_COMMUNITY): Payer: No Typology Code available for payment source

## 2020-04-05 ENCOUNTER — Other Ambulatory Visit: Payer: Self-pay

## 2020-04-05 DIAGNOSIS — Y92410 Unspecified street and highway as the place of occurrence of the external cause: Secondary | ICD-10-CM | POA: Insufficient documentation

## 2020-04-05 DIAGNOSIS — Y939 Activity, unspecified: Secondary | ICD-10-CM | POA: Insufficient documentation

## 2020-04-05 DIAGNOSIS — Z23 Encounter for immunization: Secondary | ICD-10-CM | POA: Diagnosis not present

## 2020-04-05 DIAGNOSIS — S0181XA Laceration without foreign body of other part of head, initial encounter: Secondary | ICD-10-CM | POA: Diagnosis not present

## 2020-04-05 DIAGNOSIS — S0990XA Unspecified injury of head, initial encounter: Secondary | ICD-10-CM | POA: Diagnosis present

## 2020-04-05 DIAGNOSIS — Y999 Unspecified external cause status: Secondary | ICD-10-CM | POA: Diagnosis not present

## 2020-04-05 MED ORDER — LIDOCAINE-EPINEPHRINE (PF) 2 %-1:200000 IJ SOLN
20.0000 mL | Freq: Once | INTRAMUSCULAR | Status: DC
  Filled 2020-04-05: qty 20

## 2020-04-05 MED ORDER — TETANUS-DIPHTH-ACELL PERTUSSIS 5-2.5-18.5 LF-MCG/0.5 IM SUSP
0.5000 mL | Freq: Once | INTRAMUSCULAR | Status: AC
Start: 2020-04-05 — End: 2020-04-05
  Administered 2020-04-05: 0.5 mL via INTRAMUSCULAR
  Filled 2020-04-05: qty 0.5
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
## 2020-04-05 NOTE — Discharge Instructions (Signed)
Sutures removed in 5-7 days.  Return for new or worsening symptoms
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
## 2020-05-15 ENCOUNTER — Ambulatory Visit: Payer: No Typology Code available for payment source | Attending: Internal Medicine

## 2020-05-15 DIAGNOSIS — Z20822 Contact with and (suspected) exposure to covid-19: Secondary | ICD-10-CM

## 2020-05-16 LAB — NOVEL CORONAVIRUS, NAA: SARS-CoV-2, NAA: NOT DETECTED

## 2020-05-16 LAB — SARS-COV-2, NAA 2 DAY TAT

## 2020-09-05 ENCOUNTER — Other Ambulatory Visit (HOSPITAL_COMMUNITY): Payer: Self-pay | Admitting: Physician Assistant

## 2020-09-05 ENCOUNTER — Ambulatory Visit: Payer: Self-pay

## 2020-09-15 ENCOUNTER — Other Ambulatory Visit (HOSPITAL_COMMUNITY): Payer: Self-pay | Admitting: Physician Assistant

## 2020-09-17 ENCOUNTER — Other Ambulatory Visit (HOSPITAL_COMMUNITY): Payer: Self-pay | Admitting: Sports Medicine

## 2020-12-05 ENCOUNTER — Other Ambulatory Visit (HOSPITAL_COMMUNITY): Payer: Self-pay | Admitting: Student

## 2020-12-09 ENCOUNTER — Other Ambulatory Visit (HOSPITAL_COMMUNITY): Payer: Self-pay | Admitting: Neurological Surgery

## 2021-01-08 ENCOUNTER — Other Ambulatory Visit (HOSPITAL_COMMUNITY): Payer: Self-pay | Admitting: Neurological Surgery

## 2021-01-28 ENCOUNTER — Other Ambulatory Visit (HOSPITAL_COMMUNITY): Payer: Self-pay

## 2021-01-28 MED ORDER — TRAMADOL HCL 50 MG PO TABS
50.0000 mg | ORAL_TABLET | Freq: Four times a day (QID) | ORAL | 0 refills | Status: DC | PRN
  Filled 2021-01-28: qty 30, 7d supply, fill #0
  Filled 2021-02-23: qty 30, 8d supply, fill #0

## 2021-02-06 ENCOUNTER — Other Ambulatory Visit (HOSPITAL_COMMUNITY): Payer: Self-pay

## 2021-02-23 ENCOUNTER — Other Ambulatory Visit (HOSPITAL_COMMUNITY): Payer: Self-pay
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
## 2021-07-06 ENCOUNTER — Other Ambulatory Visit (HOSPITAL_BASED_OUTPATIENT_CLINIC_OR_DEPARTMENT_OTHER): Payer: Self-pay | Admitting: Sports Medicine

## 2021-07-06 ENCOUNTER — Other Ambulatory Visit (HOSPITAL_BASED_OUTPATIENT_CLINIC_OR_DEPARTMENT_OTHER): Payer: Self-pay

## 2021-07-07 ENCOUNTER — Other Ambulatory Visit (HOSPITAL_BASED_OUTPATIENT_CLINIC_OR_DEPARTMENT_OTHER): Payer: Self-pay

## 2021-07-07 MED ORDER — GABAPENTIN 300 MG PO CAPS
ORAL_CAPSULE | ORAL | 1 refills | Status: DC
  Filled 2021-07-07: qty 90, 30d supply, fill #0

## 2021-11-05 ENCOUNTER — Other Ambulatory Visit (HOSPITAL_COMMUNITY): Payer: Self-pay

## 2021-11-05 MED ORDER — METHYLPREDNISOLONE 4 MG PO TBPK
ORAL_TABLET | ORAL | 0 refills | Status: DC
  Filled 2021-11-05: qty 21, 6d supply, fill #0

## 2021-11-05 MED ORDER — AZITHROMYCIN 250 MG PO TABS
ORAL_TABLET | ORAL | 0 refills | Status: DC
  Filled 2021-11-05: qty 6, 5d supply, fill #0

## 2022-11-25 ENCOUNTER — Other Ambulatory Visit (HOSPITAL_COMMUNITY): Payer: Self-pay | Admitting: Family Medicine

## 2022-11-25 ENCOUNTER — Ambulatory Visit (HOSPITAL_COMMUNITY)
Admission: RE | Admit: 2022-11-25 | Discharge: 2022-11-25 | Disposition: A | Discharge status: Home / Self Care | Payer: BC Managed Care – PPO | Source: Ambulatory Visit | Attending: Family Medicine | Admitting: Family Medicine

## 2022-11-25 DIAGNOSIS — R059 Cough, unspecified: Secondary | ICD-10-CM

## 2022-11-25 DIAGNOSIS — J069 Acute upper respiratory infection, unspecified: Secondary | ICD-10-CM

## 2023-06-06 ENCOUNTER — Encounter (HOSPITAL_COMMUNITY): Payer: Self-pay | Admitting: *Deleted

## 2023-06-06 ENCOUNTER — Emergency Department (HOSPITAL_COMMUNITY): Payer: BC Managed Care – PPO

## 2023-06-06 ENCOUNTER — Inpatient Hospital Stay (HOSPITAL_COMMUNITY)
Admission: EM | Admit: 2023-06-06 | Discharge: 2023-06-09 | DRG: 378 | Disposition: A | Discharge status: Home / Self Care | Payer: BC Managed Care – PPO | Attending: Infectious Disease | Admitting: Infectious Disease

## 2023-06-06 ENCOUNTER — Other Ambulatory Visit: Payer: Self-pay

## 2023-06-06 DIAGNOSIS — Z7952 Long term (current) use of systemic steroids: Secondary | ICD-10-CM

## 2023-06-06 DIAGNOSIS — R011 Cardiac murmur, unspecified: Secondary | ICD-10-CM | POA: Diagnosis present

## 2023-06-06 DIAGNOSIS — I517 Cardiomegaly: Secondary | ICD-10-CM | POA: Diagnosis present

## 2023-06-06 DIAGNOSIS — Z79899 Other long term (current) drug therapy: Secondary | ICD-10-CM | POA: Diagnosis not present

## 2023-06-06 DIAGNOSIS — K922 Gastrointestinal hemorrhage, unspecified: Principal | ICD-10-CM

## 2023-06-06 DIAGNOSIS — F1020 Alcohol dependence, uncomplicated: Secondary | ICD-10-CM | POA: Diagnosis present

## 2023-06-06 DIAGNOSIS — D500 Iron deficiency anemia secondary to blood loss (chronic): Secondary | ICD-10-CM

## 2023-06-06 DIAGNOSIS — Z981 Arthrodesis status: Secondary | ICD-10-CM

## 2023-06-06 DIAGNOSIS — K648 Other hemorrhoids: Secondary | ICD-10-CM | POA: Diagnosis present

## 2023-06-06 DIAGNOSIS — K3189 Other diseases of stomach and duodenum: Secondary | ICD-10-CM | POA: Diagnosis present

## 2023-06-06 DIAGNOSIS — D649 Anemia, unspecified: Secondary | ICD-10-CM

## 2023-06-06 DIAGNOSIS — F1010 Alcohol abuse, uncomplicated: Secondary | ICD-10-CM | POA: Diagnosis not present

## 2023-06-06 DIAGNOSIS — K2971 Gastritis, unspecified, with bleeding: Secondary | ICD-10-CM | POA: Diagnosis not present

## 2023-06-06 DIAGNOSIS — E876 Hypokalemia: Secondary | ICD-10-CM | POA: Diagnosis present

## 2023-06-06 DIAGNOSIS — K7030 Alcoholic cirrhosis of liver without ascites: Secondary | ICD-10-CM | POA: Diagnosis present

## 2023-06-06 DIAGNOSIS — D62 Acute posthemorrhagic anemia: Secondary | ICD-10-CM | POA: Diagnosis present

## 2023-06-06 DIAGNOSIS — K2980 Duodenitis without bleeding: Secondary | ICD-10-CM | POA: Diagnosis present

## 2023-06-06 DIAGNOSIS — E871 Hypo-osmolality and hyponatremia: Secondary | ICD-10-CM | POA: Diagnosis present

## 2023-06-06 DIAGNOSIS — K2921 Alcoholic gastritis with bleeding: Secondary | ICD-10-CM | POA: Diagnosis present

## 2023-06-06 DIAGNOSIS — E669 Obesity, unspecified: Secondary | ICD-10-CM | POA: Diagnosis present

## 2023-06-06 DIAGNOSIS — K219 Gastro-esophageal reflux disease without esophagitis: Secondary | ICD-10-CM | POA: Diagnosis present

## 2023-06-06 DIAGNOSIS — Z6836 Body mass index (BMI) 36.0-36.9, adult: Secondary | ICD-10-CM

## 2023-06-06 DIAGNOSIS — K5731 Diverticulosis of large intestine without perforation or abscess with bleeding: Secondary | ICD-10-CM | POA: Diagnosis present

## 2023-06-06 DIAGNOSIS — K7010 Alcoholic hepatitis without ascites: Secondary | ICD-10-CM | POA: Diagnosis present

## 2023-06-06 DIAGNOSIS — K921 Melena: Secondary | ICD-10-CM

## 2023-06-06 DIAGNOSIS — Z91030 Bee allergy status: Secondary | ICD-10-CM | POA: Diagnosis not present

## 2023-06-06 DIAGNOSIS — D72819 Decreased white blood cell count, unspecified: Secondary | ICD-10-CM | POA: Diagnosis present

## 2023-06-06 DIAGNOSIS — R0609 Other forms of dyspnea: Secondary | ICD-10-CM | POA: Diagnosis present

## 2023-06-06 DIAGNOSIS — R079 Chest pain, unspecified: Secondary | ICD-10-CM | POA: Diagnosis present

## 2023-06-06 HISTORY — DX: Alcohol dependence, uncomplicated: F10.20

## 2023-06-06 LAB — TROPONIN I (HIGH SENSITIVITY)
Troponin I (High Sensitivity): 7 ng/L (ref <18)
Troponin I (High Sensitivity): 8 ng/L (ref <18)

## 2023-06-06 LAB — CBC
HCT: 16.7 % — ABNORMAL LOW (ref 39.0–52.0)
Hemoglobin: 4.9 g/dL — CL (ref 13.0–17.0)
MCH: 22.5 pg — ABNORMAL LOW (ref 26.0–34.0)
MCHC: 29.3 g/dL — ABNORMAL LOW (ref 30.0–36.0)
MCV: 76.6 fL — ABNORMAL LOW (ref 80.0–100.0)
Platelets: 288 K/uL (ref 150–400)
RBC: 2.18 MIL/uL — ABNORMAL LOW (ref 4.22–5.81)
RDW: 15.9 % — ABNORMAL HIGH (ref 11.5–15.5)
WBC: 3.4 K/uL — ABNORMAL LOW (ref 4.0–10.5)
nRBC: 0 % (ref 0.0–0.2)

## 2023-06-06 LAB — COMPREHENSIVE METABOLIC PANEL
ALT: 32 U/L (ref 0–44)
AST: 69 U/L — ABNORMAL HIGH (ref 15–41)
Albumin: 3.2 g/dL — ABNORMAL LOW (ref 3.5–5.0)
Alkaline Phosphatase: 256 U/L — ABNORMAL HIGH (ref 38–126)
Anion gap: 10 (ref 5–15)
BUN: 18 mg/dL (ref 6–20)
CO2: 27 mmol/L (ref 22–32)
Calcium: 8.8 mg/dL — ABNORMAL LOW (ref 8.9–10.3)
Chloride: 88 mmol/L — ABNORMAL LOW (ref 98–111)
Creatinine, Ser: 1.11 mg/dL (ref 0.61–1.24)
GFR, Estimated: >60 mL/min (ref >60)
Glucose, Bld: 108 mg/dL — ABNORMAL HIGH (ref 70–99)
Potassium: 2.9 mmol/L — ABNORMAL LOW (ref 3.5–5.1)
Sodium: 125 mmol/L — ABNORMAL LOW (ref 135–145)
Total Bilirubin: 4.2 mg/dL — ABNORMAL HIGH (ref 0.3–1.2)
Total Protein: 8.3 g/dL — ABNORMAL HIGH (ref 6.5–8.1)

## 2023-06-06 LAB — PREPARE RBC (CROSSMATCH)

## 2023-06-06 LAB — RETICULOCYTES
Immature Retic Fract: 32.4 % — ABNORMAL HIGH (ref 2.3–15.9)
RBC.: 2.2 MIL/uL — ABNORMAL LOW (ref 4.22–5.81)
Retic Count, Absolute: 102.3 K/uL (ref 19.0–186.0)
Retic Ct Pct: 4.7 % — ABNORMAL HIGH (ref 0.4–3.1)

## 2023-06-06 LAB — BRAIN NATRIURETIC PEPTIDE: B Natriuretic Peptide: 88 pg/mL (ref 0.0–100.0)

## 2023-06-06 LAB — HEMOGLOBIN AND HEMATOCRIT, BLOOD
HCT: 22.3 % — ABNORMAL LOW (ref 39.0–52.0)
Hemoglobin: 7 g/dL — ABNORMAL LOW (ref 13.0–17.0)

## 2023-06-06 LAB — PROTIME-INR
INR: 1.2 (ref 0.8–1.2)
Prothrombin Time: 14.9 seconds (ref 11.4–15.2)

## 2023-06-06 LAB — IRON AND TIBC
Iron: 19 ug/dL — ABNORMAL LOW (ref 45–182)
Saturation Ratios: 4 % — ABNORMAL LOW (ref 17.9–39.5)
TIBC: 515 ug/dL — ABNORMAL HIGH (ref 250–450)
UIBC: 496 ug/dL

## 2023-06-06 LAB — MRSA NEXT GEN BY PCR, NASAL: MRSA by PCR Next Gen: NOT DETECTED

## 2023-06-06 LAB — MAGNESIUM: Magnesium: 1.8 mg/dL (ref 1.7–2.4)

## 2023-06-06 LAB — FOLATE: Folate: 6.1 ng/mL (ref >5.9)

## 2023-06-06 LAB — FERRITIN: Ferritin: 13 ng/mL — ABNORMAL LOW (ref 24–336)

## 2023-06-06 LAB — VITAMIN B12: Vitamin B-12: 745 pg/mL (ref 180–914)

## 2023-06-06 LAB — ABO/RH: ABO/RH(D): A POS

## 2023-06-06 MED ORDER — SODIUM CHLORIDE 0.9 % IV SOLN
50.0000 ug/h | INTRAVENOUS | Status: DC
  Administered 2023-06-06 – 2023-06-08 (×5): 50 ug/h via INTRAVENOUS
  Filled 2023-06-06 (×8): qty 1

## 2023-06-06 MED ORDER — ACETAMINOPHEN 325 MG PO TABS: 650.0000 mg | ORAL_TABLET | Freq: Four times a day (QID) | ORAL | Status: DC | PRN

## 2023-06-06 MED ORDER — THIAMINE MONONITRATE 100 MG PO TABS
100.0000 mg | ORAL_TABLET | Freq: Every day | ORAL | Status: DC
  Administered 2023-06-06 – 2023-06-08 (×3): 100 mg via ORAL
  Filled 2023-06-06 (×4): qty 1

## 2023-06-06 MED ORDER — PANTOPRAZOLE SODIUM 40 MG IV SOLR: 40.0000 mg | Freq: Once | INTRAVENOUS | Status: DC

## 2023-06-06 MED ORDER — ONDANSETRON HCL 4 MG/2ML IJ SOLN: 4.0000 mg | Freq: Four times a day (QID) | INTRAMUSCULAR | Status: DC | PRN

## 2023-06-06 MED ORDER — PANTOPRAZOLE SODIUM 40 MG IV SOLR: 40.0000 mg | Freq: Two times a day (BID) | INTRAVENOUS | Status: DC

## 2023-06-06 MED ORDER — THIAMINE HCL 100 MG/ML IJ SOLN: 100.0000 mg | Freq: Every day | INTRAMUSCULAR | Status: DC

## 2023-06-06 MED ORDER — SODIUM CHLORIDE 0.9% IV SOLUTION: Freq: Once | INTRAVENOUS | Status: AC

## 2023-06-06 MED ORDER — POTASSIUM CHLORIDE CRYS ER 20 MEQ PO TBCR
40.0000 meq | EXTENDED_RELEASE_TABLET | Freq: Once | ORAL | Status: AC
  Administered 2023-06-06: 40 meq via ORAL
  Filled 2023-06-06: qty 2

## 2023-06-06 MED ORDER — LACTATED RINGERS IV SOLN: INTRAVENOUS | Status: DC

## 2023-06-06 MED ORDER — CHLORHEXIDINE GLUCONATE CLOTH 2 % EX PADS
6.0000 | MEDICATED_PAD | Freq: Every day | CUTANEOUS | Status: DC
  Administered 2023-06-07 – 2023-06-09 (×3): 6 via TOPICAL

## 2023-06-06 MED ORDER — SODIUM CHLORIDE 0.9% FLUSH
3.0000 mL | Freq: Two times a day (BID) | INTRAVENOUS | Status: DC
  Administered 2023-06-06 – 2023-06-08 (×5): 3 mL via INTRAVENOUS

## 2023-06-06 MED ORDER — FOLIC ACID 1 MG PO TABS
1.0000 mg | ORAL_TABLET | Freq: Every day | ORAL | Status: DC
  Administered 2023-06-06 – 2023-06-08 (×3): 1 mg via ORAL
  Filled 2023-06-06 (×4): qty 1

## 2023-06-06 MED ORDER — HYDRALAZINE HCL 20 MG/ML IJ SOLN: 5.0000 mg | INTRAMUSCULAR | Status: DC | PRN

## 2023-06-06 MED ORDER — PANTOPRAZOLE SODIUM 40 MG IV SOLR
80.0000 mg | Freq: Once | INTRAVENOUS | Status: AC
  Administered 2023-06-06: 80 mg via INTRAVENOUS
  Filled 2023-06-06: qty 20

## 2023-06-06 MED ORDER — LORAZEPAM 1 MG PO TABS
1.0000 mg | ORAL_TABLET | ORAL | Status: AC | PRN
  Administered 2023-06-06 – 2023-06-07 (×4): 1 mg via ORAL
  Filled 2023-06-06 (×4): qty 1

## 2023-06-06 MED ORDER — SODIUM CHLORIDE 0.9 % IV SOLN
1.0000 g | INTRAVENOUS | Status: DC
  Administered 2023-06-06 – 2023-06-08 (×3): 1 g via INTRAVENOUS
  Filled 2023-06-06 (×4): qty 10

## 2023-06-06 MED ORDER — ADULT MULTIVITAMIN W/MINERALS CH
1.0000 | ORAL_TABLET | Freq: Every day | ORAL | Status: DC
  Administered 2023-06-06 – 2023-06-08 (×3): 1 via ORAL
  Filled 2023-06-06 (×4): qty 1

## 2023-06-06 MED ORDER — LORAZEPAM 2 MG/ML IJ SOLN
1.0000 mg | INTRAMUSCULAR | Status: AC | PRN
  Administered 2023-06-06 – 2023-06-07 (×3): 2 mg via INTRAVENOUS
  Filled 2023-06-06 (×3): qty 1

## 2023-06-06 MED ORDER — MAGNESIUM SULFATE 2 GM/50ML IV SOLN
2.0000 g | Freq: Once | INTRAVENOUS | Status: AC
  Administered 2023-06-06: 2 g via INTRAVENOUS
  Filled 2023-06-06: qty 50

## 2023-06-06 MED ORDER — POTASSIUM CHLORIDE 10 MEQ/100ML IV SOLN
10.0000 meq | INTRAVENOUS | Status: AC
  Administered 2023-06-06 (×5): 10 meq via INTRAVENOUS
  Filled 2023-06-06 (×5): qty 100

## 2023-06-06 MED ORDER — ONDANSETRON HCL 4 MG PO TABS: 4.0000 mg | ORAL_TABLET | Freq: Four times a day (QID) | ORAL | Status: DC | PRN

## 2023-06-06 MED ORDER — ACETAMINOPHEN 650 MG RE SUPP: 650.0000 mg | Freq: Four times a day (QID) | RECTAL | Status: DC | PRN

## 2023-06-06 MED ORDER — OCTREOTIDE LOAD VIA INFUSION
50.0000 ug | Freq: Once | INTRAVENOUS | Status: AC
  Administered 2023-06-06: 50 ug via INTRAVENOUS
  Filled 2023-06-06: qty 25

## 2023-06-06 MED ORDER — PANTOPRAZOLE INFUSION (NEW) - SIMPLE MED
8.0000 mg/h | INTRAVENOUS | Status: AC
  Administered 2023-06-06 – 2023-06-09 (×7): 8 mg/h via INTRAVENOUS
  Filled 2023-06-06 (×2): qty 80
  Filled 2023-06-06 (×2): qty 100
  Filled 2023-06-06: qty 80
  Filled 2023-06-06: qty 100
  Filled 2023-06-06 (×2): qty 80

## 2023-06-06 NOTE — Plan of Care (Signed)

## 2023-06-06 NOTE — Consult Note (Cosign Needed Addendum)
Gastroenterology Consult   Referring Provider: [facility] Primary Care Physician:  [name], MD Primary Gastroenterologist:  Dr. Marletta Lor   Patient ID: Antonio Allen; [id number]; September 24, 1976   Admit date: 06/06/2023  LOS: 0 days   Date of Consultation: 06/06/2023  Reason for Consultation:  Profound anemia, overt GI bleeding   History of Present Illness   Antonio Allen is a 47 y.o. year old male with a history of GERD, alcohol abuse drinking 1/2 pint liquor daily for many years, presenting to the ED  after presentation to PCP this morning and found to have a heart murmur and sent to ED.   Admitting labs: profoundly low Hgb at 4.9, microcytic anemia. Tbili 4.2, Alk phos 256, AST 69, albumin 3.2, sodium 125, potassium 2.9. INR 1.2. Ferritin low at 13. No thrombocytopenia. Hemodynamically stable. Due to concern for underlying liver disease, octreotide has been started. PPI infusion also started.   He notes intermittent black stools for the past 3-4 weeks. Some intermittent low volume hematochezia as well. No abdominal pain, dysphagia. GERD controlled on Nexium daily. Denies any abdominal distension. Reports lower extremity edema over past several weeks that made walking difficult. No weight loss. Appetite waxes and wanes. No confusion or mental status changes. Takes Advil at least once a day (800 mg at at time) up to 2-3 times per day. Chronically. Has not seen a PCP in about 5 years. He has not been told he has liver disease.     Alcohol use: 1/2 pint of liquor daily for many years.  No FH colon cancer or polyps.     Past Medical History:  Diagnosis Date   Alcohol dependence (HCC)    Atypical mole 05/30/2018   moderate atypia on left upper arm   Atypical mole 05/30/2018   mild atypia on mid chest   GERD (gastroesophageal reflux disease)     Past Surgical History:  Procedure Laterality Date   CERVICAL FUSION     C6   ORIF ANKLE FRACTURE Left 08/08/2013   Procedure:  OPEN REDUCTION INTERNAL FIXATION (ORIF) LEFT ANKLE FRACTURE;  Surgeon: Jacki Cones, MD;  Location: [facility];  Service: Orthopedics;  Laterality: Left;   TONSILLECTOMY      Prior to Admission medications   Medication Sig Start Date End Date Taking? Authorizing Provider  mupirocin ointment (BACTROBAN) 2 % Apply topically. 05/23/23  Yes [provider]    Current Facility-Administered Medications  Medication Dose Route Frequency Provider Last Rate Last Admin   0.9 %  sodium chloride infusion (Manually program via Guardrails IV Fluids)   Intravenous Once Jonah Blue, MD       acetaminophen (TYLENOL) tablet 650 mg  650 mg Oral Q6H PRN Jonah Blue, MD       Or   acetaminophen (TYLENOL) suppository 650 mg  650 mg Rectal Q6H PRN Jonah Blue, MD       cefTRIAXone (ROCEPHIN) 1 g in sodium chloride 0.9 % 100 mL IVPB  1 g Intravenous Q24H Jonah Blue, MD       folic acid (FOLVITE) tablet 1 mg  1 mg Oral Daily Jonah Blue, MD       hydrALAZINE (APRESOLINE) injection 5 mg  5 mg Intravenous Q4H PRN Jonah Blue, MD       lactated ringers infusion   Intravenous Continuous Jonah Blue, MD       LORazepam (ATIVAN) tablet 1-4 mg  1-4 mg Oral Q1H PRN Jonah Blue, MD  Or   LORazepam (ATIVAN) injection 1-4 mg  1-4 mg Intravenous Q1H PRN Jonah Blue, MD       multivitamin with minerals tablet 1 tablet  1 tablet Oral Daily Jonah Blue, MD       octreotide (SANDOSTATIN) 500 mcg in sodium chloride 0.9 % 250 mL (2 mcg/mL) infusion  50 mcg/hr Intravenous Continuous Jonah Blue, MD 25 mL/hr at 06/06/23 1104 50 mcg/hr at 06/06/23 1104   ondansetron (ZOFRAN) tablet 4 mg  4 mg Oral Q6H PRN Jonah Blue, MD       Or   ondansetron [facility]) injection 4 mg  4 mg Intravenous Q6H PRN Jonah Blue, MD       Melene Muller ON 06/10/2023] pantoprazole (PROTONIX) injection 40 mg  40 mg Intravenous Steva Colder, MD       pantoprozole (PROTONIX) 80 mg /NS 100 mL  infusion  8 mg/hr Intravenous Continuous Jonah Blue, MD       potassium chloride 10 mEq in 100 mL IVPB  10 mEq Intravenous Q1 Hr x 5 Jonah Blue, MD 100 mL/hr at 06/06/23 1156 10 mEq at 06/06/23 1156   sodium chloride flush (NS) 0.9 % injection 3 mL  3 mL Intravenous Q12H Jonah Blue, MD       thiamine (VITAMIN B1) tablet 100 mg  100 mg Oral Daily Jonah Blue, MD       Or   thiamine (VITAMIN B1) injection 100 mg  100 mg Intravenous Daily Jonah Blue, MD       Current Outpatient Medications  Medication Sig Dispense Refill   mupirocin ointment (BACTROBAN) 2 % Apply topically.      Allergies as of 06/06/2023 - Review Complete 06/06/2023  Allergen Reaction Noted   Bee venom Swelling 04/05/2020    History reviewed. No pertinent family history.  Social History   Socioeconomic History   Marital status: Married    Spouse name: Not on file   Number of children: Not on file   Years of education: Not on file   Highest education level: Not on file  Occupational History   Not on file  Tobacco Use   Smoking status: Never   Smokeless tobacco: Never  Vaping Use   Vaping status: Never Used  Substance and Sexual Activity   Alcohol use: Not Currently    Comment: 1 fifth of liquor daily   Drug use: No   Sexual activity: Yes  Other Topics Concern   Not on file  Social History Narrative   Not on file   Social Determinants of Health   Financial Resource Strain: Not on file  Food Insecurity: No Food Insecurity (06/06/2023)   Hunger Vital Sign    Worried About Running Out of Food in the Last Year: Never true    Ran Out of Food in the Last Year: Never true  Transportation Needs: No Transportation Needs (06/06/2023)   PRAPARE - Administrator, Civil Service (Medical): No    Lack of Transportation (Non-Medical): No  Physical Activity: Not on file  Stress: Not on file  Social Connections: Not on file  Intimate Partner Violence: Not At Risk (06/06/2023)    Humiliation, Afraid, Rape, and Kick questionnaire    Fear of Current or Ex-Partner: No    Emotionally Abused: No    Physically Abused: No    Sexually Abused: No     Review of Systems   Gen: Denies any fever, chills, loss of appetite, change in weight or weight loss CV:  Denies chest pain, heart palpitations, syncope, edema  Resp: Denies shortness of breath with rest, cough, wheezing, coughing up blood, and pleurisy. GI: Denies vomiting blood, jaundice, and fecal incontinence.   Denies dysphagia or odynophagia. GU : Denies urinary burning, blood in urine, urinary frequency, and urinary incontinence. MS: Denies joint pain, limitation of movement, swelling, cramps, and atrophy.  Derm: Denies rash, itching, dry skin, hives. Psych: Denies depression, anxiety, memory loss, hallucinations, and confusion. Heme: Denies bruising or bleeding Neuro:  Denies any headaches, dizziness, paresthesias, shaking  Physical Exam   Vital Signs in last 24 hours: Temp:  [98 F (36.7 C)-98.1 F (36.7 C)] 98.1 F (36.7 C) (08/19 1235) Pulse Rate:  [79-101] 101 (08/19 1235) Resp:  [13-22] 20 (08/19 1235) BP: (116-137)/(55-70) 126/56 (08/19 1235) SpO2:  [98 %-100 %] 98 % (08/19 1130) Weight:  [124.7 kg] 124.7 kg (08/19 0901)    General:   Alert,  Well-developed, sallow-appearing Head:  Normocephalic and atraumatic. Eyes:  mild scleral icterus Ears:  Normal auditory acuity. Lungs:  Clear throughout to auscultation.   No wheezes, crackles, or rhonchi. No acute distress. Heart:  S1 S2 present with systolic murmur Abdomen:  Soft, nontender and nondistended. Large AP diameter. Difficult to appreciate if HSM Rectal: deferred   Msk:  Symmetrical without gross deformities. Normal posture. Extremities:  With 1+ bilateral lower extremity edema. Neurologic:  Alert and  oriented x4. Skin:  Intact without significant lesions or rashes. Psych:  Alert and cooperative. Normal mood and affect.  Intake/Output from  previous day: No intake/output data recorded. Intake/Output this shift: No intake/output data recorded.    Labs/Studies   Recent Labs Recent Labs    06/06/23 0930  WBC 3.4*  HGB 4.9*  HCT 16.7*  PLT 288   BMET Recent Labs    06/06/23 0930  NA 125*  K 2.9*  CL 88*  CO2 27  GLUCOSE 108*  BUN 18  CREATININE 1.11  CALCIUM 8.8*   LFT Recent Labs    06/06/23 0930  PROT 8.3*  ALBUMIN 3.2*  AST 69*  ALT 32  ALKPHOS 256*  BILITOT 4.2*   PT/INR Recent Labs    06/06/23 1043  LABPROT 14.9  INR 1.2    Radiology/Studies DG Chest Port 1 View  Result Date: 06/06/2023 CLINICAL DATA:  Dyspnea on exertion EXAM: PORTABLE CHEST 1 VIEW COMPARISON:  CXR 11/25/22 FINDINGS: No pleural effusion. No pneumothorax. No focal airspace opacity. Cardiomegaly, likely similar to prior when accounting for differences in technique. No focal airspace opacity. No radiographically apparent displaced rib fractures. Visualized upper abdomen is unremarkable. IMPRESSION: No focal airspace opacity. Electronically Signed   By: Lorenza Cambridge M.D.   On: 06/06/2023 11:15     Assessment   Antonio Allen is a 47 y.o. year old male with a history of GERD, alcohol abuse drinking 1/2 pint liquor daily for many years, presenting to the ED  after presentation to PCP this morning with concerns for new heart murmur and found to have profoundly low Hgb at 4.9.  Acute blood loss anemia: noting melena for several weeks and intermittent low-volume hematochezia in setting of NSAIDs and alcohol abuse. Hgb 4.9 on admission and receiving blood currently. Notable alcohol intake for many years and concern for underlying liver disease; however, he does not have thrombocytopenia. No documented varices but octreotide was started by ED out of abundance of caution. Will start with EGD on 8/20. He may ultimately need colonoscopy as inpatient; however, may be able to  do as outpatient. If EGD negative, needs colonoscopy as unable  to exclude right-sided colonic lesion.   Abnormal LFTs: in setting of alcohol. DF less than 32 (actually in 12 range). Component of alcoholic hepatitis likely contributing. Check viral serologies to be thorough. US abdomen completed also ordered.      Plan / Recommendations    Clear liquids NPO after midnight EGD on 8/20 Continue Octreotide and PPI for now Colonoscopy if EGD negative US abdomen complete Viral serologies to be thorough Follow LFTs Recommend alcohol cessation     06/06/2023, 12:40 PM  Gelene Mink, PhD, ANP-BC [facility]

## 2023-06-06 NOTE — ED Triage Notes (Signed)
Pt c/o intermittent mid chest pain with exertion x weeks. Pt also c/o bilateral leg swelling 2 weeks ago, no hx of CHF. Pt reports his legs feel very heavy and cause him to not be able to ambulate well along with feeling like his legs are weak and getting SOB. Pt went to see his PCP this morning and was told he had a "significant new heart murmur" that they noticed and sent him to the ED for further evaluation.

## 2023-06-06 NOTE — ED Notes (Signed)
ED TO INPATIENT HANDOFF REPORT  ED Nurse Name and Phone #: [phone] Pilar Jarvis Name/Age/Gender Antonio Allen 47 y.o. male Room/Bed: APA02/APA02  Code Status   Code Status: Prior  Home/SNF/Other Home Patient oriented to: self, place, time, and situation Is this baseline? Yes   Triage Complete: Triage complete  Chief Complaint Acute upper GI bleeding [K92.2]  Triage Note Pt c/o intermittent mid chest pain with exertion x weeks. Pt also c/o bilateral leg swelling 2 weeks ago, no hx of CHF. Pt reports his legs feel very heavy and cause him to not be able to ambulate well along with feeling like his legs are weak and getting SOB. Pt went to see his PCP this morning and was told he had a "significant new heart murmur" that they noticed and sent him to the ED for further evaluation.    Allergies Allergies  Allergen Reactions   Bee Venom Swelling    Level of Care/Admitting Diagnosis ED Disposition     ED Disposition  Admit   Condition  --   Comment  Hospital Area: [facility] [[id number]]  Level of Care: Stepdown [14]  Covid Evaluation: Asymptomatic - no recent exposure (last 10 days) testing not required  Diagnosis: Acute upper GI bleeding [272536]  Admitting Physician: Jonah Blue [[id number]]  Attending Physician: Jonah Blue [[id number]]  Certification:: I certify this patient will need inpatient services for at least 2 midnights  Expected Medical Readiness: 06/09/2023          B Medical/Surgery History Past Medical History:  Diagnosis Date   Atypical mole 05/30/2018   moderate atypia on left upper arm   Atypical mole 05/30/2018   mild atypia on mid chest   GERD (gastroesophageal reflux disease)    Past Surgical History:  Procedure Laterality Date   CERVICAL FUSION     C6   ORIF ANKLE FRACTURE Left 08/08/2013   Procedure: OPEN REDUCTION INTERNAL FIXATION (ORIF) LEFT ANKLE FRACTURE;  Surgeon: Jacki Cones, MD;  Location: [facility];  Service:  Orthopedics;  Laterality: Left;   TONSILLECTOMY       A IV Location/Drains/Wounds Patient Lines/Drains/Airways Status     Active Line/Drains/Airways     Name Placement date Placement time Site Days   Peripheral IV 06/06/23 20 G Right Antecubital 06/06/23  0927  Antecubital  less than 1   Peripheral IV 06/06/23 18 G Left Antecubital 06/06/23  1025  Antecubital  less than 1            Intake/Output Last 24 hours No intake or output data in the 24 hours ending 06/06/23 1200  Labs/Imaging Results for orders placed or performed during the hospital encounter of 06/06/23 (from the past 48 hour(s))  CBC     Status: Abnormal   Collection Time: 06/06/23  9:30 AM  Result Value Ref Range   WBC 3.4 (L) 4.0 - 10.5 K/uL   RBC 2.18 (L) 4.22 - 5.81 MIL/uL   Hemoglobin 4.9 (LL) 13.0 - 17.0 g/dL    Comment: REPEATED TO VERIFY Reticulocyte Hemoglobin testing may be clinically indicated, consider ordering this additional test [id number] THIS CRITICAL RESULT HAS VERIFIED AND BEEN CALLED TO L Belicia Difatta BY SHANA DALTON ON 08 19 2024 AT 0950, AND HAS BEEN READ BACK.     HCT 16.7 (L) 39.0 - 52.0 %   MCV 76.6 (L) 80.0 - 100.0 fL   MCH 22.5 (L) 26.0 - 34.0 pg   MCHC 29.3 (L) 30.0 - 36.0 g/dL  RDW 15.9 (H) 11.5 - 15.5 %   Platelets 288 150 - 400 K/uL   nRBC 0.0 0.0 - 0.2 %    Comment: Performed at [facility] [address], [place], [place] [address]  Troponin I (High Sensitivity)     Status: None   Collection Time: 06/06/23  9:30 AM  Result Value Ref Range   Troponin I (High Sensitivity) 7 <18 ng/L    Comment: (NOTE) Elevated high sensitivity troponin I (hsTnI) values and significant  changes across serial measurements may suggest ACS but many other  chronic and acute conditions are known to elevate hsTnI results.  Refer to the "Links" section for chest pain algorithms and additional  guidance. Performed at [facility], [address] [place], [place] [address]   Comprehensive metabolic  panel     Status: Abnormal   Collection Time: 06/06/23  9:30 AM  Result Value Ref Range   Sodium 125 (L) 135 - 145 mmol/L   Potassium 2.9 (L) 3.5 - 5.1 mmol/L   Chloride 88 (L) 98 - 111 mmol/L   CO2 27 22 - 32 mmol/L   Glucose, Bld 108 (H) 70 - 99 mg/dL    Comment: Glucose reference range applies only to samples taken after fasting for at least 8 hours.   BUN 18 6 - 20 mg/dL   Creatinine, Ser 5.62 0.61 - 1.24 mg/dL   Calcium 8.8 (L) 8.9 - 10.3 mg/dL   Total Protein 8.3 (H) 6.5 - 8.1 g/dL   Albumin 3.2 (L) 3.5 - 5.0 g/dL   AST 69 (H) 15 - 41 U/L   ALT 32 0 - 44 U/L   Alkaline Phosphatase 256 (H) 38 - 126 U/L   Total Bilirubin 4.2 (H) 0.3 - 1.2 mg/dL   GFR, Estimated >13 >08 mL/min    Comment: (NOTE) Calculated using the CKD-EPI Creatinine Equation (2021)    Anion gap 10 5 - 15    Comment: Performed at [facility], [address], [place], [place] [address]  Brain natriuretic peptide     Status: None   Collection Time: 06/06/23  9:30 AM  Result Value Ref Range   B Natriuretic Peptide 88.0 0.0 - 100.0 pg/mL    Comment: Performed at [facility], [address], [place], [place] [address]  Type and screen [facility]     Status: None   Collection Time: 06/06/23 10:43 AM  Result Value Ref Range   ABO/RH(D) A POS    Antibody Screen NEG    Sample Expiration      06/09/2023,2359 Performed at [facility], [address] [place], [place] [address]   Prepare RBC (crossmatch)     Status: None   Collection Time: 06/06/23 10:43 AM  Result Value Ref Range   Order Confirmation      ORDER PROCESSED BY BLOOD BANK Performed at [facility], [address] [place], [place] [address]   Protime-INR     Status: None   Collection Time: 06/06/23 10:43 AM  Result Value Ref Range   Prothrombin Time 14.9 11.4 - 15.2 seconds   INR 1.2 0.8 - 1.2    Comment: (NOTE) INR goal varies based on device and disease states. Performed at [facility], [address], [place], [place] [address]    Magnesium     Status: None   Collection Time: 06/06/23 10:43 AM  Result Value Ref Range   Magnesium 1.8 1.7 - 2.4 mg/dL    Comment: Performed at [facility]  [facility], [address], [place], [place] [address]  Vitamin B12     Status: None   Collection Time: 06/06/23 10:43 AM  Result Value Ref Range   Vitamin B-12 745 180 - 914 pg/mL    Comment: (NOTE) This assay is not validated for testing neonatal or myeloproliferative syndrome specimens for Vitamin B12 levels. Performed at [facility], [address], [place], [place] [address]   Folate     Status: None   Collection Time: 06/06/23 10:43 AM  Result Value Ref Range   Folate 6.1 >5.9 ng/mL    Comment: Performed at [facility], [address], [place], [place] [address]  Iron and TIBC     Status: Abnormal   Collection Time: 06/06/23 10:43 AM  Result Value Ref Range   Iron 19 (L) 45 - 182 ug/dL   TIBC 027 (H) 253 - 664 ug/dL   Saturation Ratios 4 (L) 17.9 - 39.5 %   UIBC 496 ug/dL    Comment: Performed at [facility], [address], [place], [place] [address]  Ferritin     Status: Abnormal   Collection Time: 06/06/23 10:43 AM  Result Value Ref Range   Ferritin 13 (L) 24 - 336 ng/mL    Comment: Performed at [facility], [address] [place], [place] [address]  Reticulocytes     Status: Abnormal   Collection Time: 06/06/23 10:43 AM  Result Value Ref Range   Retic Ct Pct 4.7 (H) 0.4 - 3.1 %   RBC. 2.20 (L) 4.22 - 5.81 MIL/uL   Retic Count, Absolute 102.3 19.0 - 186.0 K/uL   Immature Retic Fract 32.4 (H) 2.3 - 15.9 %    Comment: Performed at [facility], [address] [place], [place] [address]   DG Chest Port 1 View  Result Date: 06/06/2023 CLINICAL DATA:  Dyspnea on exertion EXAM: PORTABLE CHEST 1 VIEW COMPARISON:  CXR 11/25/22 FINDINGS: No pleural effusion. No pneumothorax. No focal airspace opacity. Cardiomegaly, likely similar to prior when accounting for differences in technique. No focal airspace opacity. No radiographically  apparent displaced rib fractures. Visualized upper abdomen is unremarkable. IMPRESSION: No focal airspace opacity. Electronically Signed   By: Lorenza Cambridge M.D.   On: 06/06/2023 11:15    Pending Labs Unresulted Labs (From admission, onward)     Start     Ordered   06/06/23 1200  ABO/Rh  Once,   STAT        06/06/23 1200            Vitals/Pain Today's Vitals   06/06/23 1000 06/06/23 1045 06/06/23 1100 06/06/23 1130  BP: 136/70 132/63 (!) 123/57 128/66  Pulse: 86 82 85 88  Resp: (!) 21 13 20 16   Temp:      TempSrc:      SpO2: 100% 100% 100% 98%  Weight:      Height:      PainSc:        Isolation Precautions No active isolations  Medications Medications  0.9 %  sodium chloride infusion (Manually program via Guardrails IV Fluids) (has no administration in time range)  potassium chloride 10 mEq in 100 mL IVPB (10 mEq Intravenous New Bag/Given 06/06/23 1156)  octreotide (SANDOSTATIN) 2 mcg/mL load via infusion 50 mcg (50 mcg Intravenous Bolus from Bag 06/06/23 1105)    And  octreotide (SANDOSTATIN) 500 mcg in sodium chloride 0.9 % 250 mL (2 mcg/mL) infusion (50 mcg/hr Intravenous New Bag/Given 06/06/23 1104)  magnesium sulfate IVPB 2 g 50  mL (0 g Intravenous Stopped 06/06/23 1155)  pantoprazole (PROTONIX) injection 80 mg (80 mg Intravenous Given 06/06/23 1041)    Mobility walks     Focused Assessments Neuro Assessment Handoff:  Swallow screen pass?  No need         Neuro Assessment:  WNL Neuro Checks:      Has TPA been given? No If patient is a Neuro Trauma and patient is going to OR before floor call report to 4N Charge nurse: [phone] or [phone]   R Recommendations: See Admitting Provider Note  Report given to:   Additional Notes: Pt drinks a fifth of vodka daily. His last drink was this morning prior to this visit. Pt A&Ox4. Very Jaundice and has been for months he said. He does have itching symptoms. Pt has not been to a PCP in 5 years except this  morning.  Wife at bedside. Pt states he has saw dark blood from time to time when he vomites and some in stools.

## 2023-06-06 NOTE — H&P (Signed)
History and Physical    Patient: Antonio Allen OZH:[id number] DOB: [birth year] DOA: 06/06/2023 DOS: the patient was seen and examined on 06/06/2023 PCP: [name], MD  Patient coming from: Home - lives with wife and 2 daughters (19, 46yo); NOK: Wife, [place], [phone]   Chief Complaint: chest pain/SOB  HPI: Antonio Allen is a 47 y.o. male with medical history significant of GERD presenting with chest pain/SOB. He reports several weeks of LE edema, progressive SOB, chest pain.  He has had periodic tarry stools, worse in the last few weeks.  He went fishing with his daughter 2 days ago and had to stop 5 times from the car to the pier.  His wife finally convinced him to go to the doctor today - none in 5 years other than at the time of neck fusion surgery 2-3 years ago.  He drinks a fifth of vodka daily.  He has tried to stop drinking multiple times in the past, has never gone to rehab.  +withdrawal symptoms in the past when not drinking but no seizures or overt DTs.    ER Course:  Exertional dyspnea x 2 weeks.  New murmur at PCP.  Pale, jaundiced, tarry stools for weeks.  Drinks 1/2 fifth daily.  Hgb 4.9.  Bili 4.2.  GI consulted, given Octreotide and Protonix.     Review of Systems: As mentioned in the history of present illness. All other systems reviewed and are negative. Past Medical History:  Diagnosis Date   Alcohol dependence (HCC)    Atypical mole 05/30/2018   moderate atypia on left upper arm   Atypical mole 05/30/2018   mild atypia on mid chest   GERD (gastroesophageal reflux disease)    Past Surgical History:  Procedure Laterality Date   CERVICAL FUSION     C6   ORIF ANKLE FRACTURE Left 08/08/2013   Procedure: OPEN REDUCTION INTERNAL FIXATION (ORIF) LEFT ANKLE FRACTURE;  Surgeon: Jacki Cones, MD;  Location: [facility];  Service: Orthopedics;  Laterality: Left;   TONSILLECTOMY     Social History:  reports that he has never smoked. He has never used  smokeless tobacco. He reports that he does not currently use alcohol. He reports that he does not use drugs.  Allergies  Allergen Reactions   Bee Venom Swelling    History reviewed. No pertinent family history.  Prior to Admission medications   Medication Sig Start Date End Date Taking? Authorizing Provider  mupirocin ointment (BACTROBAN) 2 % Apply topically. 05/23/23  Yes [provider]  predniSONE (DELTASONE) 10 MG tablet Take by mouth. 12/24/22  Yes [provider]  azithromycin (ZITHROMAX) 250 MG tablet Take 2 tablets by mouth today then 1 tablet by mouth for days 2-5 11/05/21     gabapentin (NEURONTIN) 300 MG capsule Take 1 capsule by mouth 3 times daily 07/06/21     ibuprofen (ADVIL) 800 MG tablet Take 800 mg by mouth daily.    [provider]  methylPREDNISolone (MEDROL DOSEPAK) 4 MG TBPK tablet Take as directed 11/05/21     traMADol (ULTRAM) 50 MG tablet Take 1 tablet (50 mg total) by mouth every 6 (six) hours as needed. 01/28/21       Physical Exam: Vitals:   06/06/23 1100 06/06/23 1130 06/06/23 1215 06/06/23 1235  BP: (!) 123/57 128/66 (!) 116/55 (!) 126/56  Pulse: 85 88 98 (!) 101  Resp: 20 16 (!) 22 20  Temp:   98.1 F (36.7 C) 98.1 F (36.7  C)  TempSrc:   Oral Oral  SpO2: 100% 98%    Weight:      Height:       General:  Appears calm and comfortable and is in NAD Eyes:   EOMI, normal lids, iris; +scleral icterus ENT:  grossly normal hearing, lips & tongue, mmm; appropriate dentition Neck:  no LAD, masses or thyromegaly Cardiovascular:  RRR, no m/r/g. No LE edema.  Respiratory:   CTA bilaterally with no wheezes/rales/rhonchi.  Normal respiratory effort. Abdomen:  soft, NT, ND Skin:  no rash or induration seen on limited exam; + varicosities of BLE Musculoskeletal:  grossly normal tone BUE/BLE, good ROM, no bony abnormality Psychiatric:  grossly normal mood and affect, speech fluent and appropriate, AOx3 Neurologic:  CN 2-12 grossly intact,  moves all extremities in coordinated fashion    Radiological Exams on Admission: Independently reviewed - see discussion in A/P where applicable  DG Chest Port 1 View  Result Date: 06/06/2023 CLINICAL DATA:  Dyspnea on exertion EXAM: PORTABLE CHEST 1 VIEW COMPARISON:  CXR 11/25/22 FINDINGS: No pleural effusion. No pneumothorax. No focal airspace opacity. Cardiomegaly, likely similar to prior when accounting for differences in technique. No focal airspace opacity. No radiographically apparent displaced rib fractures. Visualized upper abdomen is unremarkable. IMPRESSION: No focal airspace opacity. Electronically Signed   By: Lorenza Cambridge M.D.   On: 06/06/2023 11:15    EKG: Independently reviewed.  NSR with rate 78; nonspecific ST changes with no evidence of acute ischemia   Labs on Admission: I have personally reviewed the available labs and imaging studies at the time of the admission.  Pertinent labs:    Na++ 125 K+ 2.9 Glucose 108 AP 256 Albumin 3.2 ALT 69 Bili 4.2 BNP 88 HS troponin 7 Iron 19, ferritin 13 WBC 3.4 Hgb 4.9 INR 1.2   Assessment and Plan: Principal Problem:   Acute upper GI bleeding Active Problems:   ABLA (acute blood loss anemia)   Alcohol dependence (HCC)   Cirrhosis, alcoholic (HCC)   Hypokalemia    Upper GI Bleeding -Patient is presenting with melena and DOE, suggestive of upper GI bleeding. -Has long-standing h/o heavy ETOH intake, concerning for variceal bleeding Vs. gastric or duodenal ulcer, esophagitis or gastritis, or Mallory-Weiss tear. -The patient is not tachycardic with normal blood pressure, suggesting subacute volume loss.  -Will admit to SDU -GI consulted by ED, will follow up recommendations -NPO for possible EGD -LR at 100 mL/hr -Start IV pantoprazole bolus and infusion given frank bleeding with concern for hemodynamic instability -Will also start Octreotide load and infusion -Zofran IV for nausea -Avoid NSAIDs and SQ  heparin -Maintain IV access (2 large bore IVs if possible). -Hold ASA for now -Empiric Ceftriaxone should be given to those with Marjo Bicker B cirrhosis who are presenting with GI bleeding; will start IV Rocephin 1 g X 7 days  ABLA -Patient's CP/SOB and fatigue are most likely caused by anemia secondary to upper GI bleeding.  -His Hgb is 4.9 today.  -Type and screen were done in ED.  -Three units of blood were ordered by ED.  -Monitor closely and follow cbc q12h, transfuse as necessary for Hbg <7  ETOH dependence -Patient with chronic ETOH dependence -Number of drinks per day: 1 fifth vodka -Number of admissions for management of alcohol withdrawal syndrome (detox): 0 -History of withdrawal seizures, ICU admissions, DTs: no -CIWA protocol -Folate, thiamine, and MVI ordered -Will provide symptom-triggered BZD (ativan per CIWA protocol) only since the patient is able to  communicate; is not showing current signs of delirium; and has no history of severe withdrawal. -TOC team consult for possible inpatient treatment -Elevated LFTs are likely related to alcoholism -Consider offering a medication for Alcohol Use Disorder at the time of d/c, to include Disulfuram; Naltrexone; or Acamprosate.  Cirrhosis -MELD/MELD-Na score is 25, with a mortality rate of 19.6% -Child-Pugh category is B, with an 80% 1-year survival -Patient needs to completely stop drinking alcohol -No current evidence of ascites, but with concern for esophageal varices, as above -Hyponatremia is likely associated with this issue  Hypokalemia -Repleted in ER.   -Will follow.   -Normal Mag level.      Advance Care Planning:   Code Status: Full Code   Consults: GI  DVT Prophylaxis: SCDs  Family Communication: Wife was present throughout evaluation  Severity of Illness: The appropriate patient status for this patient is INPATIENT. Inpatient status is judged to be reasonable and necessary in order to provide the  required intensity of service to ensure the patient's safety. The patient's presenting symptoms, physical exam findings, and initial radiographic and laboratory data in the context of their chronic comorbidities is felt to place them at high risk for further clinical deterioration. Furthermore, it is not anticipated that the patient will be medically stable for discharge from the hospital within 2 midnights of admission.   * I certify that at the point of admission it is my clinical judgment that the patient will require inpatient hospital care spanning beyond 2 midnights from the point of admission due to high intensity of service, high risk for further deterioration and high frequency of surveillance required.*  Author: Jonah Blue, MD 06/06/2023 12:44 PM  For on call review [email or web address].

## 2023-06-06 NOTE — TOC Initial Note (Signed)
Transition of Care [facility]) - Initial/Assessment Note    Patient Details  Name: Antonio Allen MRN: [id number] Date of Birth: 12/27/1975  Transition of [facility] CM/SW Contact:    Leitha Bleak, RN Phone Number: 06/06/2023, 1:47 PM  Clinical Narrative:       Patient admitted with acute upper GI bleed. TOC consulted for Substance abuse resources. TOC spoke with his wife. Patient has not been to rehab or meetings for alcohol abuse. He does not want to discuss anything here in hospital. They are agreeable to [facility] adding resources to discharge papers for them to review.             Expected Discharge Plan: Home/Self Care Barriers to Discharge: Continued Medical Work up   Patient Goals and CMS Choice Patient states their goals for this hospitalization and ongoing recovery are:: to go home CMS Medicare.gov Compare Post Acute Care list provided to:: Patient Represenative (must comment) Choice offered to / list presented to : Spouse      Expected Discharge Plan and Services      Living arrangements for the past 2 months: Single Family Home           Prior Living Arrangements/Services Living arrangements for the past 2 months: Single Family Home   Patient language and need for interpreter reviewed:: Yes        Need for Family Participation in Patient Care: Yes (Comment) Care giver support system in place?: Yes (comment)   Criminal Activity/Legal Involvement Pertinent to Current Situation/Hospitalization: No - Comment as needed  Activities of Daily Living Home Assistive Devices/Equipment: None ADL Screening (condition at time of admission) Patient's cognitive ability adequate to safely complete daily activities?: Yes Is the patient deaf or have difficulty hearing?: No Does the patient have difficulty seeing, even when wearing glasses/contacts?: No Does the patient have difficulty concentrating, remembering, or making decisions?: No Patient able to express need for assistance  with ADLs?: Yes Does the patient have difficulty dressing or bathing?: No Independently performs ADLs?: Yes (appropriate for developmental age) Does the patient have difficulty walking or climbing stairs?: No Weakness of Legs: None Weakness of Arms/Hands: None  Permission Sought/Granted     Emotional Assessment      Orientation: : Oriented to Self, Oriented to Place, Oriented to  Time, Oriented to Situation Alcohol / Substance Use: Alcohol Use Psych Involvement: No (comment)  Admission diagnosis:  Acute upper GI bleeding [K92.2] Gastrointestinal hemorrhage, unspecified gastrointestinal hemorrhage type [K92.2] Anemia, unspecified type [D64.9] Patient Active Problem List   Diagnosis Date Noted   Acute upper GI bleeding 06/06/2023   ABLA (acute blood loss anemia) 06/06/2023   Alcohol dependence (HCC) 06/06/2023   Cirrhosis, alcoholic (HCC) 06/06/2023   Hypokalemia 06/06/2023   PCP:  [name], MD Pharmacy:   [facility] [address] [place] [address] Phone: [phone] Fax: [phone]  [id number] - [place], [place] - [address] AT [facility] [address] [place] [address] Phone: [phone] Fax: [phone]  [facility] [phone] - [place], [place] - [address] AT [facility] [address]  [place] [address] Phone: [phone] Fax: [phone]     Social Determinants of Health (SDOH) Social History: SDOH Screenings   Food Insecurity: No Food Insecurity (06/06/2023)  Housing: Low Risk  (06/06/2023)  Transportation Needs: No Transportation Needs (06/06/2023)  Utilities: Not At Risk (06/06/2023)  Tobacco Use: Low Risk  (06/06/2023)   SDOH Interventions:    Readmission Risk Interventions     No data to display

## 2023-06-06 NOTE — ED Provider Notes (Signed)
[place] EMERGENCY DEPARTMENT AT [facility] Provider Note   CSN: [id number] Arrival date & time: 06/06/23  0847     History  Chief Complaint  Patient presents with   Chest Pain    Antonio Allen is a 47 y.o. male.  He has no known medical conditions but has not been following regularly with primary care.  Presents to ER complaining of shortness of breath on exertion for about 2 weeks, states he walks about 15 yards he gets short of breath and developed some tightness in the center of his chest, also having swelling in his legs throughout the day bilaterally with no calf tenderness, no redness.  He states radicular morning it is not bad but then it is worse at night.  Does not smoke, no family history of cardiac disease. Denies cough, no sputum production. He saw his PCP this morning who was concerned and heard a heart murmur and wanted him to be evaluated in the ER.  Does note he drinks about half of 1/5 of liquor a day for many years.  Also admits to dark tarry stools for the past couple of weeks no hematemesis.   Chest Pain      Home Medications Prior to Admission medications   Medication Sig Start Date End Date Taking? Authorizing Provider  azithromycin (ZITHROMAX) 250 MG tablet Take 2 tablets by mouth today then 1 tablet by mouth for days 2-5 11/05/21     gabapentin (NEURONTIN) 300 MG capsule Take 1 capsule by mouth 3 times daily 07/06/21     ibuprofen (ADVIL) 800 MG tablet Take 800 mg by mouth daily.    [provider]  methylPREDNISolone (MEDROL DOSEPAK) 4 MG TBPK tablet Take as directed 11/05/21     traMADol (ULTRAM) 50 MG tablet Take 1 tablet (50 mg total) by mouth every 6 (six) hours as needed. 01/28/21         Allergies    Bee venom    Review of Systems   Review of Systems  Cardiovascular:  Positive for chest pain.    Physical Exam Updated Vital Signs BP 137/68 (BP Location: Left Arm)   Pulse 85   Temp 98 F (36.7 C) (Oral)   Resp 16    Ht 6\' 1"  (1.854 m)   Wt 124.7 kg   SpO2 99%   BMI 36.28 kg/m  Physical Exam Vitals and nursing note reviewed.  Constitutional:      General: He is not in acute distress.    Appearance: He is well-developed.  HENT:     Head: Normocephalic and atraumatic.  Eyes:     Conjunctiva/sclera: Conjunctivae normal.  Cardiovascular:     Rate and Rhythm: Normal rate and regular rhythm.     Heart sounds: Murmur heard.  Pulmonary:     Effort: Pulmonary effort is normal. No respiratory distress.     Breath sounds: Normal breath sounds.  Abdominal:     Palpations: Abdomen is soft.     Tenderness: There is no abdominal tenderness.  Musculoskeletal:        General: No swelling. Normal range of motion.     Cervical back: Neck supple.     Right lower leg: No tenderness. No edema.     Left lower leg: No tenderness. No edema.  Skin:    General: Skin is warm and dry.     Capillary Refill: Capillary refill takes less than 2 seconds.     Coloration: Skin is pale.  Neurological:  General: No focal deficit present.     Mental Status: He is alert.  Psychiatric:        Mood and Affect: Mood normal.     ED Results / Procedures / Treatments   Labs (all labs ordered are listed, but only abnormal results are displayed) Labs Reviewed - No data to display  EKG None  Radiology No results found.  Procedures .Critical Care  Performed by: Ma Rings, PA-C Authorized by: Ma Rings, PA-C   Critical care provider statement:    Critical care time (minutes):  30   Critical care was necessary to treat or prevent imminent or life-threatening deterioration of the following conditions:  Circulatory failure   Critical care was time spent personally by me on the following activities:  Development of treatment plan with patient or surrogate, discussions with consultants, evaluation of patient's response to treatment, examination of patient, ordering and review of laboratory studies, ordering  and review of radiographic studies, ordering and performing treatments and interventions, pulse oximetry, re-evaluation of patient's condition, review of old charts and obtaining history from patient or surrogate   Care discussed with comment:  Lewie Loron, Dr. Ophelia Charter     Medications Ordered in ED Medications - No data to display  ED Course/ Medical Decision Making/ A&P Clinical Course as of 06/06/23 1127  Mon Jun 06, 2023  1005 Patient noted to be significantly anemic, reports drinking about half 1/5 of liquor a day, several weeks of dark tarry stool.  CMP resulted and showed elevated bilirubin.  INR added.  Potassium low 2.9 and will replete potassium and empirically replete magnesium.  Patient consents to transfusion [CB]  1049 GI was consulted.  I spoke with Tobi Bastos the nurse practitioner on with Dr. Marletta Lor today and they will see the patient. [CB]    Clinical Course User Index [CB] Ma Rings, PA-C                                 Medical Decision Making This patient presents to the ED for concern of dyspnea on exertion, leg swelling, new heart murmur, this involves an extensive number of treatment options, and is a complaint that carries with it a high risk of complications and morbidity.  The differential diagnosis includes ACS, PE, heart failure, symptomatic anemia,, pneumonia, electrolyte abnormality, other   Co morbidities that complicate the patient evaluation :   Alcohol use disorder   Additional history obtained:  Additional history obtained from EMR External records from outside source obtained and reviewed including prior chest xray, labs   Lab Tests:  I Ordered, and personally interpreted labs.  The pertinent results include: CBC shows mild leukopenia, significant anemia with hemoglobin of 4.9 platelets are normal.  Troponin and BNP are normal.  CMP shows hyponatremia, hypokalemia, elevated bilirubin at 4.2   Imaging Studies ordered:  I ordered imaging studies  including chest x-ray which shows cardiomegaly I independently visualized and interpreted imaging within scope of identifying emergent findings  I agree with the radiologist interpretation   Cardiac Monitoring: / EKG:  The patient was maintained on a cardiac monitor.  I personally viewed and interpreted the cardiac monitored which showed an underlying rhythm of: Sinus rhythm No ST or T wave changes on EKG from previous   Consultations Obtained:  I requested consultation with the hospitalist, Dr. Ophelia Charter,  and discussed lab and imaging findings as well as pertinent plan - they  recommend: admission Also discussed with NP for Dr. Marletta Lor   Problem List / ED Course / Critical interventions / Medication management  Dyspnea on exertion likely related to symptomatic anemia with hemoglobin of 4.9.  Feel this is secondary to GI bleeding given his black tarry stools in the setting of daily alcohol abuse.  Given 80 mg of Protonix and started octreotide drip.  GI consulted.  Element of high-output heart failure, appears to have some cardiomegaly on his chest x-ray, newly discovered heart murmur.  I ordered medication including protonix and octreotide for GI bleeding  Reevaluation of the patient after these medicines showed that the patient stayed the same I have reviewed the patients home medicines and have made adjustments as needed       Amount and/or Complexity of Data Reviewed Labs: ordered. Radiology: ordered.  Risk Prescription drug management.          Final Clinical Impression(s) / ED Diagnoses Final diagnoses:  None    Rx / DC Orders ED Discharge Orders     None         Ma Rings, PA-C 06/06/23 1151    Derwood Kaplan, MD 06/07/23 1120

## 2023-06-07 ENCOUNTER — Encounter (HOSPITAL_COMMUNITY)
Admission: EM | Disposition: A | Discharge status: Home / Self Care | Payer: Self-pay | Source: Home / Self Care | Attending: Internal Medicine

## 2023-06-07 ENCOUNTER — Inpatient Hospital Stay (HOSPITAL_COMMUNITY): Payer: BC Managed Care – PPO | Admitting: Anesthesiology

## 2023-06-07 ENCOUNTER — Encounter (HOSPITAL_COMMUNITY): Payer: Self-pay | Admitting: Internal Medicine

## 2023-06-07 ENCOUNTER — Inpatient Hospital Stay (HOSPITAL_COMMUNITY): Payer: BC Managed Care – PPO

## 2023-06-07 DIAGNOSIS — K2980 Duodenitis without bleeding: Secondary | ICD-10-CM | POA: Diagnosis not present

## 2023-06-07 DIAGNOSIS — K2921 Alcoholic gastritis with bleeding: Secondary | ICD-10-CM | POA: Diagnosis not present

## 2023-06-07 DIAGNOSIS — K2971 Gastritis, unspecified, with bleeding: Secondary | ICD-10-CM

## 2023-06-07 HISTORY — PX: HOT HEMOSTASIS: SHX5433

## 2023-06-07 HISTORY — PX: BIOPSY: SHX5522

## 2023-06-07 HISTORY — PX: ESOPHAGOGASTRODUODENOSCOPY (EGD) WITH PROPOFOL: SHX5813

## 2023-06-07 LAB — PROTIME-INR
INR: 1.2 (ref 0.8–1.2)
Prothrombin Time: 15.3 seconds — ABNORMAL HIGH (ref 11.4–15.2)

## 2023-06-07 LAB — COMPREHENSIVE METABOLIC PANEL
ALT: 34 U/L (ref 0–44)
AST: 99 U/L — ABNORMAL HIGH (ref 15–41)
Albumin: 3.1 g/dL — ABNORMAL LOW (ref 3.5–5.0)
Alkaline Phosphatase: 235 U/L — ABNORMAL HIGH (ref 38–126)
Anion gap: 8 (ref 5–15)
BUN: 17 mg/dL (ref 6–20)
CO2: 25 mmol/L (ref 22–32)
Calcium: 8.5 mg/dL — ABNORMAL LOW (ref 8.9–10.3)
Chloride: 96 mmol/L — ABNORMAL LOW (ref 98–111)
Creatinine, Ser: 1.11 mg/dL (ref 0.61–1.24)
GFR, Estimated: >60 mL/min (ref >60)
Glucose, Bld: 132 mg/dL — ABNORMAL HIGH (ref 70–99)
Potassium: 4.3 mmol/L (ref 3.5–5.1)
Sodium: 129 mmol/L — ABNORMAL LOW (ref 135–145)
Total Bilirubin: 5.6 mg/dL — ABNORMAL HIGH (ref 0.3–1.2)
Total Protein: 7.9 g/dL (ref 6.5–8.1)

## 2023-06-07 LAB — CBC
HCT: 22.8 % — ABNORMAL LOW (ref 39.0–52.0)
Hemoglobin: 7 g/dL — ABNORMAL LOW (ref 13.0–17.0)
MCH: 25 pg — ABNORMAL LOW (ref 26.0–34.0)
MCHC: 30.7 g/dL (ref 30.0–36.0)
MCV: 81.4 fL (ref 80.0–100.0)
Platelets: 279 10*3/uL (ref 150–400)
RBC: 2.8 MIL/uL — ABNORMAL LOW (ref 4.22–5.81)
RDW: 15.6 % — ABNORMAL HIGH (ref 11.5–15.5)
WBC: 3.4 10*3/uL — ABNORMAL LOW (ref 4.0–10.5)
nRBC: 0 % (ref 0.0–0.2)

## 2023-06-07 LAB — HEMOGLOBIN AND HEMATOCRIT, BLOOD
HCT: 24.2 % — ABNORMAL LOW (ref 39.0–52.0)
Hemoglobin: 7.5 g/dL — ABNORMAL LOW (ref 13.0–17.0)

## 2023-06-07 LAB — HEPATITIS PANEL, ACUTE
HCV Ab: NONREACTIVE
Hep A IgM: NONREACTIVE
Hep B C IgM: NONREACTIVE
Hepatitis B Surface Ag: NONREACTIVE

## 2023-06-07 LAB — PREPARE RBC (CROSSMATCH)

## 2023-06-07 LAB — HIV ANTIBODY (ROUTINE TESTING W REFLEX): HIV Screen 4th Generation wRfx: NONREACTIVE

## 2023-06-07 SURGERY — ESOPHAGOGASTRODUODENOSCOPY (EGD) WITH PROPOFOL
Anesthesia: General

## 2023-06-07 MED ORDER — LACTATED RINGERS IV SOLN: INTRAVENOUS | Status: DC

## 2023-06-07 MED ORDER — SODIUM CHLORIDE 0.9 % IV SOLN: INTRAVENOUS | Status: DC

## 2023-06-07 MED ORDER — LIDOCAINE HCL 1 % IJ SOLN
INTRAMUSCULAR | Status: DC | PRN
  Administered 2023-06-07: 50 mg via INTRADERMAL

## 2023-06-07 MED ORDER — PROPOFOL 10 MG/ML IV BOLUS
INTRAVENOUS | Status: DC | PRN
Start: 2023-06-07 — End: 2023-06-07
  Administered 2023-06-07 (×2): 50 mg via INTRAVENOUS
  Administered 2023-06-07: 100 mg via INTRAVENOUS
  Administered 2023-06-07 (×2): 50 mg via INTRAVENOUS

## 2023-06-07 MED ORDER — SODIUM CHLORIDE 0.9% IV SOLUTION: Freq: Once | INTRAVENOUS | Status: AC

## 2023-06-07 NOTE — Hospital Course (Signed)
47yo male with h/o GERD who presented on 8/19 with CP/SOB.  Significant ETOH use disorder reported.  Found to have symptomatic anemia, Hgb 4.9, +melena.  Concern for UGI bleed.  Started on Protonix and Octreotide.  Transfused 3 units PRBC.  GI consulted.  On CIWA.  Abdominal US showed probable cirrhosis.  EGD on 8/20 showed hemorrhagic alcoholic gastritis treated with APC and duodenitis.  He will need to complete Vantin for 5 days.

## 2023-06-07 NOTE — Progress Notes (Signed)
We will proceed with EGD as scheduled.  I thoroughly discussed with the patient the procedure, including the risks involved. Patient understands what the procedure involves including the benefits and any risks. Patient understands alternatives to the proposed procedure. Risks including (but not limited to) bleeding, tearing of the lining (perforation), rupture of adjacent organs, problems with heart and lung function, infection, and medication reactions. A small percentage of complications may require surgery, hospitalization, repeat endoscopic procedure, and/or transfusion.  Patient understood and agreed.  Daniel Castaneda, MD Gastroenterology and Hepatology [facility]  

## 2023-06-07 NOTE — Anesthesia Postprocedure Evaluation (Signed)
Anesthesia Post Note  Patient: Antonio Allen  Procedure(s) Performed: ESOPHAGOGASTRODUODENOSCOPY (EGD) WITH PROPOFOL BIOPSY HOT HEMOSTASIS (ARGON PLASMA COAGULATION/BICAP)  Patient location during evaluation: Short Stay Anesthesia Type: General Level of consciousness: awake and alert Pain management: pain level controlled Vital Signs Assessment: post-procedure vital signs reviewed and stable Respiratory status: spontaneous breathing Cardiovascular status: blood pressure returned to baseline and stable Postop Assessment: no apparent nausea or vomiting Anesthetic complications: no   No notable events documented.   Last Vitals:  Vitals:   06/07/23 1300 06/07/23 1359  BP: 127/61 127/66  Pulse: 81 80  Resp: 16 18  Temp:  36.9 C  SpO2: 95% 98%    Last Pain:  Vitals:   06/07/23 1438  TempSrc:   PainSc: 0-No pain                 Salem Mastrogiovanni

## 2023-06-07 NOTE — Progress Notes (Signed)
Progress Note   Patient: Antonio Allen GNF:[id number] DOB: 02/08/1976 DOA: 06/06/2023     1 DOS: the patient was seen and examined on 06/07/2023   Brief hospital course: 47yo male with h/o GERD who presented on 8/19 with CP/SOB.  Significant ETOH use disorder reported.  Found to have symptomatic anemia, Hgb 4.9, +melena.  Concern for UGI bleed.  Started on Protonix and Octreotide.  Transfused 3 units PRBC.  GI consulted.  On CIWA.  Abdominal US ordered.  EGD on 8/20.  Assessment and Plan:  Upper GI Bleeding -Patient is presenting with melena and DOE, suggestive of upper GI bleeding. -Has long-standing h/o heavy ETOH intake, concerning for variceal bleeding Vs. gastric or duodenal ulcer, esophagitis or gastritis, or Mallory-Weiss tear. -The patient is not tachycardic with normal blood pressure, suggesting subacute volume loss.  -Admitted to SDU -GI consulted -NPO for EGD today -LR at 100 mL/hr -On IV Pantaprazole and Octreotide -Zofran IV for nausea -Avoid NSAIDs and SQ heparin -Maintain IV access (2 large bore IVs if possible). -Hold ASA for now -Empiric Ceftriaxone should be given to those with Marjo Bicker B cirrhosis who are presenting with GI bleeding; on IV Rocephin 1 g X 7 days   ABLA -Patient's CP/SOB and fatigue are most likely caused by anemia secondary to upper GI bleeding.  -His Hgb was 4.9 on presentation, 7 this AM .  -Three units of blood were ordered by ED. -Another unit was given this AM -Monitor closely and follow cbc post-transfusion and again in AM, transfuse as necessary for Hbg <7   ETOH dependence -Patient with chronic ETOH dependence -Number of drinks per day: 1 fifth vodka -Number of admissions for management of alcohol withdrawal syndrome (detox): 0 -History of withdrawal seizures, ICU admissions, DTs: no -CIWA protocol -Folate, thiamine, and MVI ordered -Will provide symptom-triggered BZD (ativan per CIWA protocol) only since the patient is able to  communicate; is not showing current signs of delirium; and has no history of severe withdrawal. -TOC team consult for possible inpatient treatment -Elevated LFTs are likely related to alcoholism -Consider offering a medication for Alcohol Use Disorder at the time of d/c, to include Disulfuram; Naltrexone; or Acamprosate.   Cirrhosis -Hepatic steatosis vs. Cirrhosis on [place] with splenomegaly and possible portal venous HTN -MELD/MELD-Na score is 25, with a mortality rate of 19.6% -Child-Pugh category is B, with an 80% 1-year survival -Patient needs to completely stop drinking alcohol -No current evidence of ascites -Hyponatremia is likely associated with this issue, slowly improving   Hypokalemia -Repleted in ER.   -Will follow.   -Normal Mag level.    Consultants: GI  Procedures: EGD 8/20  Antibiotics: Ceftriaxone 8/19-25  30 Day Unplanned Readmission Risk Score    Flowsheet Row ED to Hosp-Admission (Current) from 06/06/2023 in [facility]  30 Day Unplanned Readmission Risk Score (%) 9.19 Filed at 06/07/2023 0801       This score is the patient's risk of an unplanned readmission within 30 days of being discharged (0 -100%). The score is based on dignosis, age, lab data, medications, orders, and past utilization.   Low:  0-14.9   Medium: 15-21.9   High: 22-29.9   Extreme: 30 and above           Subjective: No specific concerns today.  Stood at bedside but has not been out of bed much.   Objective: Vitals:   06/07/23 0400 06/07/23 0742  BP: (!) 129/54   Pulse: 80  Resp: 16   Temp:  98.3 F (36.8 C)  SpO2: 93%     Intake/Output Summary (Last 24 hours) at 06/07/2023 0803 Last data filed at 06/07/2023 0500 Gross per 24 hour  Intake 1915 ml  Output 250 ml  Net 1665 ml   Filed Weights   06/06/23 0901 06/07/23 0500  Weight: 124.7 kg 125.3 kg    Exam:  General:  Appears calm and comfortable and is in NAD Eyes:   EOMI, normal lids, iris;  +scleral icterus ENT:  grossly normal hearing, lips & tongue, mmm; appropriate dentition Neck:  no LAD, masses or thyromegaly Cardiovascular:  RRR, no m/r/g. No LE edema.  Respiratory:   CTA bilaterally with no wheezes/rales/rhonchi.  Normal respiratory effort. Abdomen:  soft, NT, ND Skin:  no rash or induration seen on limited exam; + varicosities of BLE Musculoskeletal:  grossly normal tone BUE/BLE, good ROM, no bony abnormality Psychiatric:  grossly normal mood and affect, speech fluent and appropriate, AOx3 Neurologic:  CN 2-12 grossly intact, moves all extremities in coordinated fashion    Data Reviewed: I have reviewed the patient's lab results since admission.  Pertinent labs for today include:  Na++ 129, up from 125 Glucose 132 AP 235, down from 256 Albumin 3.1 AST 99, up from 69 Bili 5.6, up from 4.2 WBC 3.4 Hgb 7 INR 1.2    Family Communication: Wife present throughout evaluation  Disposition: Status is: Inpatient Remains inpatient appropriate because: ongoing evaluation and treatment  Planned Discharge Destination: Home    Time spent: 50 minutes  Author: Jonah Blue, MD 06/07/2023 8:03 AM  For on call review [email or web address].

## 2023-06-07 NOTE — Transfer of Care (Signed)
Immediate Anesthesia Transfer of Care Note  Patient: Antonio Allen  Procedure(s) Performed: ESOPHAGOGASTRODUODENOSCOPY (EGD) WITH PROPOFOL BIOPSY HOT HEMOSTASIS (ARGON PLASMA COAGULATION/BICAP)  Patient Location: PACU  Anesthesia Type:General  Level of Consciousness: awake  Airway & Oxygen Therapy: Patient Spontanous Breathing  Post-op Assessment: Report given to RN  Post vital signs: Reviewed  Last Vitals:  Vitals Value Taken Time  BP 125/83 06/07/23 1511  Temp    Pulse 86 06/07/23 1512  Resp 17 06/07/23 1512  SpO2 98 % 06/07/23 1512  Vitals shown include unfiled device data.  Last Pain:  Vitals:   06/07/23 1438  TempSrc:   PainSc: 0-No pain         Complications: No notable events documented.

## 2023-06-07 NOTE — Brief Op Note (Addendum)
06/07/2023  3:12 PM  PATIENT:  Antonio Allen  47 y.o. male  PRE-OPERATIVE DIAGNOSIS:  profound anemia, melena, IDA  POST-OPERATIVE DIAGNOSIS:  gastritis; gastric ulcer; gastric biopsies; duodenitis; hot hemostasis in gastric;  PROCEDURE:  Procedure(s): ESOPHAGOGASTRODUODENOSCOPY (EGD) WITH PROPOFOL (N/A) BIOPSY HOT HEMOSTASIS (ARGON PLASMA COAGULATION/BICAP)  SURGEON:  Surgeons and Role:    * Dolores Frame, MD - Primary  Patient underwent EGD under propofol sedation.  Tolerated the procedure adequately.   FINDINGS: - Normal esophagus.  - Gastritis with hemorrhage.  Treated with argon plasma coagulation (APC).  - Scar in the gastric antrum.  Biopsied.  - Duodenitis.   RECOMMENDATIONS - Return patient to hospital ward for ongoing care.  - Soft diet today.  - Use Protonix (pantoprazole) 40 mg PO BID.  - Await pathology results.  - No aspirin, ibuprofen, naproxen, or other non-steroidal anti-inflammatory drugs.  - Alcohol cessation  Katrinka Blazing, MD Gastroenterology and Hepatology [facility]

## 2023-06-07 NOTE — Op Note (Addendum)
[facility] Patient Name: Antonio Allen Procedure Date: 06/07/2023 2:20 PM MRN: [id number] Date of Birth: 04-06-1976 Attending MD: Katrinka Blazing , , [id number] CSN: [id number] Age: 47 Admit Type: Inpatient Procedure:                Upper GI endoscopy Indications:              Iron deficiency anemia, Melena Providers:                Katrinka Blazing, Angelica Ran, Dyann Ruddle, Elinor Parkinson Referring MD:              Medicines:                Monitored Anesthesia Care Complications:            No immediate complications. Estimated Blood Loss:     Estimated blood loss: none. Procedure:                Pre-Anesthesia Assessment:                           - Prior to the procedure, a History and Physical                            was performed, and patient medications, allergies                            and sensitivities were reviewed. The patient's                            tolerance of previous anesthesia was reviewed.                           - The risks and benefits of the procedure and the                            sedation options and risks were discussed with the                            patient. All questions were answered and informed                            consent was obtained.                           - ASA Grade Assessment: II - A patient with mild                            systemic disease.                           After obtaining informed consent, the endoscope was                            passed under direct vision. Throughout the  procedure, the patient's blood pressure, pulse, and                            oxygen saturations were monitored continuously. The                            [id number]-[id number] ([id number]) scope was introduced through the                            mouth, and advanced to the second part of duodenum.                            The upper GI endoscopy was accomplished without                             difficulty. The patient tolerated the procedure                            well. Scope In: 2:46:40 PM Scope Out: 3:03:54 PM Total Procedure Duration: 0 hours 17 minutes 14 seconds  Findings:      The examined esophagus was normal.      Patchy severe inflammation with hemorrhage characterized by adherent       blood, congestion (edema), erythema and mucus was found in the gastric       fundus and in the gastric body. Spontaneous mucosal bleeding was noted       just distal to the GE junction, at the fundal side. Coagulation for       hemostasis using argon plasma at 0.3 liters/minute and 20 watts was       successful.      A 10 mm healed ulcer was found in the gastric antrum. The scar tissue       was healthy in appearance. Biopsies were taken with a cold forceps for       Helicobacter pylori testing. Estimated blood loss: none.      Diffuse mild inflammation characterized by congestion (edema) and       erythema was found in the duodenal bulb. Impression:               - Normal esophagus.                           - Gastritis with hemorrhage. Treated with argon                            plasma coagulation (APC).                           - Scar in the gastric antrum. Biopsied.                           - Duodenitis. Moderate Sedation:      Per Anesthesia Care Recommendation:           - Return patient to hospital ward for ongoing care.                           - Soft  diet today.                           - Use Protonix (pantoprazole) 40 mg PO BID.                           - Await pathology results.                           - No aspirin, ibuprofen, naproxen, or other                            non-steroidal anti-inflammatory drugs.                           - Alcohol cessation Procedure Code(s):        --- Professional ---                           43255, 59, Esophagogastroduodenoscopy, flexible,                            transoral; with control of bleeding,  any method                           43239, Esophagogastroduodenoscopy, flexible,                            transoral; with biopsy, single or multiple Diagnosis Code(s):        --- Professional ---                           K29.71, Gastritis, unspecified, with bleeding                           K31.89, Other diseases of stomach and duodenum                           K29.80, Duodenitis without bleeding                           D50.9, Iron deficiency anemia, unspecified                           K92.1, Melena (includes Hematochezia) CPT copyright 2022 American Medical Association. All rights reserved. The codes documented in this report are preliminary and upon coder review may  be revised to meet current compliance requirements. Katrinka Blazing, MD Katrinka Blazing,  06/07/2023 3:13:32 PM This report has been signed electronically. Number of Addenda: 0

## 2023-06-07 NOTE — Anesthesia Preprocedure Evaluation (Signed)
Anesthesia Evaluation  Patient identified by MRN, date of birth, ID band Patient awake    Reviewed: Allergy & Precautions, H&P , NPO status , Patient's Chart, lab work & pertinent test results, reviewed documented beta blocker date and time   Airway Mallampati: II  TM Distance: >3 FB Neck ROM: full    Dental no notable dental hx.    Pulmonary neg pulmonary ROS, Patient abstained from smoking.   Pulmonary exam normal breath sounds clear to auscultation       Cardiovascular Exercise Tolerance: Good negative cardio ROS  Rhythm:regular Rate:Normal     Neuro/Psych negative neurological ROS  negative psych ROS   GI/Hepatic negative GI ROS,GERD  ,,(+) Cirrhosis   Esophageal Varices      Endo/Other  negative endocrine ROS    Renal/GU negative Renal ROS  negative genitourinary   Musculoskeletal   Abdominal   Peds  Hematology negative hematology ROS (+) Blood dyscrasia, anemia   Anesthesia Other Findings   Reproductive/Obstetrics negative OB ROS                             Anesthesia Physical Anesthesia Plan  ASA: 4 and emergent  Anesthesia Plan: General   Post-op Pain Management:    Induction:   PONV Risk Score and Plan: Propofol infusion  Airway Management Planned:   Additional Equipment:   Intra-op Plan:   Post-operative Plan:   Informed Consent: I have reviewed the patients History and Physical, chart, labs and discussed the procedure including the risks, benefits and alternatives for the proposed anesthesia with the patient or authorized representative who has indicated his/her understanding and acceptance.     Dental Advisory Given  Plan Discussed with: CRNA  Anesthesia Plan Comments:        Anesthesia Quick Evaluation

## 2023-06-08 ENCOUNTER — Inpatient Hospital Stay (HOSPITAL_COMMUNITY): Payer: BC Managed Care – PPO

## 2023-06-08 DIAGNOSIS — K2921 Alcoholic gastritis with bleeding: Secondary | ICD-10-CM | POA: Diagnosis not present

## 2023-06-08 LAB — CBC WITH DIFFERENTIAL/PLATELET
Abs Immature Granulocytes: 0.01 10*3/uL (ref 0.00–0.07)
Abs Immature Granulocytes: 0.01 10*3/uL (ref 0.00–0.07)
Basophils Absolute: 0.1 10*3/uL (ref 0.0–0.1)
Basophils Absolute: 0.1 10*3/uL (ref 0.0–0.1)
Basophils Relative: 2 %
Basophils Relative: 2 %
Eosinophils Absolute: 0.3 10*3/uL (ref 0.0–0.5)
Eosinophils Absolute: 0.4 10*3/uL (ref 0.0–0.5)
Eosinophils Relative: 10 %
Eosinophils Relative: 10 %
HCT: 24.7 % — ABNORMAL LOW (ref 39.0–52.0)
HCT: 27.1 % — ABNORMAL LOW (ref 39.0–52.0)
Hemoglobin: 7.4 g/dL — ABNORMAL LOW (ref 13.0–17.0)
Hemoglobin: 8 g/dL — ABNORMAL LOW (ref 13.0–17.0)
Immature Granulocytes: 0 %
Immature Granulocytes: 0 %
Lymphocytes Relative: 20 %
Lymphocytes Relative: 23 %
Lymphs Abs: 0.6 10*3/uL — ABNORMAL LOW (ref 0.7–4.0)
Lymphs Abs: 0.8 10*3/uL (ref 0.7–4.0)
MCH: 24.8 pg — ABNORMAL LOW (ref 26.0–34.0)
MCH: 24.5 pg — ABNORMAL LOW (ref 26.0–34.0)
MCHC: 30 g/dL (ref 30.0–36.0)
MCHC: 29.5 g/dL — ABNORMAL LOW (ref 30.0–36.0)
MCV: 82.9 fL (ref 80.0–100.0)
MCV: 83.1 fL (ref 80.0–100.0)
Monocytes Absolute: 0.3 10*3/uL (ref 0.1–1.0)
Monocytes Absolute: 0.4 10*3/uL (ref 0.1–1.0)
Monocytes Relative: 10 %
Monocytes Relative: 11 %
Neutro Abs: 1.7 10*3/uL (ref 1.7–7.7)
Neutro Abs: 1.9 10*3/uL (ref 1.7–7.7)
Neutrophils Relative %: 58 %
Neutrophils Relative %: 54 %
Platelets: 271 10*3/uL (ref 150–400)
Platelets: 294 10*3/uL (ref 150–400)
RBC: 2.98 MIL/uL — ABNORMAL LOW (ref 4.22–5.81)
RBC: 3.26 MIL/uL — ABNORMAL LOW (ref 4.22–5.81)
RDW: 15.9 % — ABNORMAL HIGH (ref 11.5–15.5)
RDW: 16.2 % — ABNORMAL HIGH (ref 11.5–15.5)
WBC: 3 10*3/uL — ABNORMAL LOW (ref 4.0–10.5)
WBC: 3.6 10*3/uL — ABNORMAL LOW (ref 4.0–10.5)
nRBC: 0 % (ref 0.0–0.2)
nRBC: 0 % (ref 0.0–0.2)

## 2023-06-08 LAB — BPAM RBC
Blood Product Expiration Date: 202409172359
Blood Product Expiration Date: 202409202359
Blood Product Expiration Date: 202409202359
Blood Product Expiration Date: 202409202359
ISSUE DATE / TIME: 202408191217
ISSUE DATE / TIME: 202408191451
ISSUE DATE / TIME: 202408191738
ISSUE DATE / TIME: 202408200857
Unit Type and Rh: 6200
Unit Type and Rh: 6200
Unit Type and Rh: 6200
Unit Type and Rh: 6200

## 2023-06-08 LAB — COMPREHENSIVE METABOLIC PANEL
ALT: 37 U/L (ref 0–44)
AST: 101 U/L — ABNORMAL HIGH (ref 15–41)
Albumin: 3 g/dL — ABNORMAL LOW (ref 3.5–5.0)
Alkaline Phosphatase: 219 U/L — ABNORMAL HIGH (ref 38–126)
Anion gap: 7 (ref 5–15)
BUN: 14 mg/dL (ref 6–20)
CO2: 23 mmol/L (ref 22–32)
Calcium: 8.4 mg/dL — ABNORMAL LOW (ref 8.9–10.3)
Chloride: 99 mmol/L (ref 98–111)
Creatinine, Ser: 1.18 mg/dL (ref 0.61–1.24)
GFR, Estimated: >60 mL/min (ref >60)
Glucose, Bld: 159 mg/dL — ABNORMAL HIGH (ref 70–99)
Potassium: 4.2 mmol/L (ref 3.5–5.1)
Sodium: 129 mmol/L — ABNORMAL LOW (ref 135–145)
Total Bilirubin: 5.3 mg/dL — ABNORMAL HIGH (ref 0.3–1.2)
Total Protein: 7.6 g/dL (ref 6.5–8.1)

## 2023-06-08 LAB — TYPE AND SCREEN
ABO/RH(D): A POS
Antibody Screen: NEGATIVE
Unit division: 0
Unit division: 0
Unit division: 0
Unit division: 0

## 2023-06-08 MED ORDER — PEG 3350-KCL-NA BICARB-NACL 420 G PO SOLR
4000.0000 mL | Freq: Once | ORAL | Status: AC
  Administered 2023-06-08: 4000 mL via ORAL

## 2023-06-08 NOTE — H&P (View-Only) (Signed)
Subjective: No abdominal pain. He had a small melanotic stool during our encounter with noted BRB in the toilet. No nausea or vomiting.   Objective: Vital signs in last 24 hours: Temp:  [98 F (36.7 C)-98.5 F (36.9 C)] 98.1 F (36.7 C) (08/21 0755) Pulse Rate:  [78-89] 83 (08/21 0400) Resp:  [14-22] 17 (08/21 0000) BP: (118-140)/(58-76) 118/60 (08/21 0400) SpO2:  [95 %-99 %] 95 % (08/21 0000) Weight:  [125.3 kg] 125.3 kg (08/20 1359) Last BM Date : 06/06/23 General:   Alert and oriented, pleasant Head:  Normocephalic and atraumatic. Eyes:  scleral icterus   Mouth:  Without lesions, mucosa pink and moist.  Heart:  S1, S2 present, no murmurs noted.  Lungs: Clear to auscultation bilaterally, without wheezing, rales, or rhonchi.  Abdomen:  Bowel sounds present, soft, non-tender, non-distended. No HSM or hernias noted. No rebound or guarding. No masses appreciated  Msk:  Symmetrical without gross deformities. Normal posture. Pulses:  Normal pulses noted. Extremities:  Without clubbing or edema. Neurologic:  Alert and  oriented x4;  grossly normal neurologically. Skin:  Warm and dry, intact without significant lesions.  Psych:  Alert and cooperative. Normal mood and affect.  Intake/Output from previous day: 08/20 0701 - 08/21 0700 In: 2526 [P.O.:300; I.V.:1686.2; Blood:348; IV Piggyback:191.8] Out: 0  Intake/Output this shift: Total I/O In: 240 [P.O.:240] Out: -   Lab Results: Recent Labs    06/06/23 0930 06/06/23 2244 06/07/23 0454 06/07/23 1559 06/08/23 0827  WBC 3.4*  --  3.4*  --  3.0*  HGB 4.9*   < > 7.0* 7.5* 7.4*  HCT 16.7*   < > 22.8* 24.2* 24.7*  PLT 288  --  279  --  271   < > = values in this interval not displayed.   BMET Recent Labs    06/06/23 0930 06/07/23 0454 06/08/23 0827  NA 125* 129* 129*  K 2.9* 4.3 4.2  CL 88* 96* 99  CO2 27 25 23   GLUCOSE 108* 132* 159*  BUN 18 17 14   CREATININE 1.11 1.11 1.18  CALCIUM 8.8* 8.5* 8.4*    LFT Recent Labs    06/06/23 0930 06/07/23 0454 06/08/23 0827  PROT 8.3* 7.9 7.6  ALBUMIN 3.2* 3.1* 3.0*  AST 69* 99* 101*  ALT 32 34 37  ALKPHOS 256* 235* 219*  BILITOT 4.2* 5.6* 5.3*   PT/INR Recent Labs    06/06/23 1043 06/07/23 0454  LABPROT 14.9 15.3*  INR 1.2 1.2   Hepatitis Panel Recent Labs    06/06/23 2244  HEPBSAG NON REACTIVE  HCVAB NON REACTIVE  HEPAIGM NON REACTIVE  HEPBIGM NON REACTIVE   Studies/Results: US Abdomen Complete  Result Date: 06/07/2023 CLINICAL DATA:  Elevated liver function tests. EXAM: ABDOMEN ULTRASOUND COMPLETE COMPARISON:  None Available. FINDINGS: Gallbladder: No gallstones or wall thickening visualized. No sonographic Murphy sign noted by sonographer. Common bile duct: Not visualized. Liver: Heterogeneous appearance is noted which may represent hepatic steatosis, but focal lesions cannot be excluded. Portal vein is patent on color Doppler imaging with normal direction of blood flow towards the liver. IVC: No abnormality visualized. Pancreas: Visualized portion is unremarkable. Spleen: Measures 17.1 x 7.9 x 7.6 cm for calculated volume of 1344 cc consistent with severe splenomegaly. Right Kidney: Length: 11 cm. Echogenicity within normal limits. No mass or hydronephrosis visualized. Left Kidney: Length: 13.5 cm. Echogenicity within normal limits. No mass or hydronephrosis visualized. Abdominal aorta: No aneurysm visualized. Other findings: Recanalized umbilical vein is noted suggesting portal  hypertension. IMPRESSION: Heterogeneous echogenicity of hepatic parenchyma is noted suggesting hepatic steatosis or other diffuse hepatocellular disease, but focal lesions cannot be excluded. Further evaluation with CT scan is recommended. Moderate splenomegaly is noted as well as recanalized umbilical vein suggesting portal venous hypertension. Electronically Signed   By: Lupita Raider M.D.   On: 06/07/2023 13:39    Assessment: Antonio Allen is a  46 year old male with history of GERD, alcohol abuse who presented to the ED on Monday after PCP sent him in for a heart murmur.  Found to have hemoglobin of 4.9 on arrival, GI consulted for further evaluation.  Acute blood loss anemia: Reported melena consistently for the past month and intermittent low-volume hematochezia in setting of NSAID and alcohol abuse. He reports BRBPR can sometimes not happen for several weeks.  Hemoglobin 4.9 on admission.  Octreotide was started in the ED out of abundance of caution.  EGD yesterday with gastritis with hemorrhage, treated with APC.  Also a duodenitis.    Hgb 7.4 this morning/7.5 yesterday.  Can DC octreotide drip given no EV's. Continue PPI twice daily.  Rocephin 2 g prophylaxis/vantin as outpatient to complete 5 days total of SBP prophylaxis given concern for possible underlying cirrhosis.  Patient had a small BM during our encounter with BRB noted in the toilet. This was his first BM since Sunday. Small amount of stool that was melanotic which is to be expected for the next couple of days.,Though ongoing BRB is concerning, especially in conjunction with borderline hemoglobin this morning. Has had previous colonoscopy at age 49 for rectal bleeding, diagnosed with hemorrhoids then. Unfortunately cannot rule out other sources of bleeding in the LGI tract at this time. Recommend proceeding with inpatient colonoscopy tomorrow for further evaluation. He did eat breakfast this morning around 730 but no other solids since. Will start clear liquid diet, NPO at midnight and prep for colonoscopy tomorrow. Indications, risks and benefits of procedure discussed in detail with patient. Patient verbalized understanding and is in agreement to proceed with Colonoscopy.  Abnormal LFTs: In setting of alcohol use.  DF 15.9 (with PT control 13 from yesterday), no steroids warranted at this time. possibly a component of alcoholic hepatitis.  Ultrasound done yesterday suggestive of  portal venous hypertension, heterogeneous echogenicity of hepatic parenchyma suggesting hepatic steatosis/hepatocellular disease, would favor cirrhosis in setting of portal venous hypertension.  Bilirubin improved to 5.3 today, alkaline phosphatase down to 219.  AST remains elevated at 101, ALT 37.  Acute hepatitis panel on 8/19 was negative  Plan: Continue PPI BID Can stop octreotide Rocephin 2g-can transition to vantin outpatient to complete 5 days total Further workup of suspected cirrhosis as outpatient Alcohol cessation NSAID avoidance Monitor for overt GI bleeding, trend H&H Clear liquid diet, will make NPO at midnight in preparation for Colonoscopy tomorrow     LOS: 2 days    06/08/2023, 10:19 AM  Chelsea L. Jeanmarie Hubert, MSN, APRN, AGNP-C Adult-Gerontology Nurse Practitioner [facility] at [facility]   Attending note: Agree with further GI evaluation via colonoscopy.  If it is unrevealing, would have a low threshold for looking back in his upper GI tract in the same setting.  Electrolyte management per attending.

## 2023-06-08 NOTE — Progress Notes (Signed)
Progress Note   Patient: Antonio Allen:[id number] DOB: 1976/02/02 DOA: 06/06/2023     2 DOS: the patient was seen and examined on 06/08/2023   Brief hospital course: 47yo male with h/o GERD who presented on 8/19 with CP/SOB.  Significant ETOH use disorder reported.  Found to have symptomatic anemia, Hgb 4.9, +melena.  Concern for UGI bleed.  Started on Protonix and Octreotide.  Transfused 3 units PRBC.  GI consulted.  On CIWA.  Abdominal US showed probable cirrhosis.  EGD on 8/20 showed hemorrhagic alcoholic gastritis treated with APC and duodenitis.  He will need to complete Vantin for 5 days.  Assessment and Plan:  Upper GI Bleeding -Patient presented with melena and DOE, suggestive of upper GI bleeding. -Has long-standing h/o heavy ETOH intake -The patient is not tachycardic with normal blood pressure, suggesting subacute volume loss.  -Admitted to SDU, stable and will transfer to med surg today -GI consulted -EGD 8/20 with hemorrhagic alcoholic gastritis and duodenitis, s/p APC -IVF stopped -On IV Pantaprazole, will need BID PPI at time of dc -On Octreotide but this was stopped since he does not have current evidence of varices -Zofran IV for nausea -Avoid NSAIDs and SQ heparin -Maintain IV access (2 large bore IVs if possible). -Hold ASA for now -Empiric Ceftriaxone should be given to those with Marjo Bicker B cirrhosis who are presenting with GI bleeding; on IV Rocephin 1 g daily, day 3; plan to complete 5 total days with transition to PO Vantin if dc prior to day 5   ABLA -Patient's CP/SOB and fatigue are most likely caused by anemia secondary to upper GI bleeding.  -His Hgb was 4.9 on presentation, 7.4 this AM .  -s/p 4 units so far -GI reported that patient had rectal bleeding with fairly large amount of BRB in the commode this AM; plan for colonoscopy tomorrow -Monitor closely and follow cbc q12h, transfuse as necessary for Hbg <7   ETOH dependence -Patient with chronic  ETOH dependence -Number of drinks per day: 1 fifth vodka -Number of admissions for management of alcohol withdrawal syndrome (detox): 0 -History of withdrawal seizures, ICU admissions, DTs: no -CIWA protocol -Folate, thiamine, and MVI ordered -Will provide symptom-triggered BZD (ativan per CIWA protocol) only since the patient is able to communicate; is not showing current signs of delirium; and has no history of severe withdrawal. -TOC team consulted -Elevated LFTs are likely related to alcoholism -Consider offering a medication for Alcohol Use Disorder at the time of d/c, to include Disulfuram; Naltrexone; or Acamprosate. -He is not interested in attending AA meetings or going to rehab, based on our conversation this AM   Cirrhosis -Hepatic steatosis vs. Cirrhosis on [place] with splenomegaly and possible portal venous HTN -MELD/MELD-Na score is 25, with a mortality rate of 19.6% -Child-Pugh category is B, with an 80% 1-year survival -Patient needs to completely stop drinking alcohol -No current evidence of ascites -Hyponatremia is likely associated with this issue, slowly improving   Hypokalemia -Repleted in ER.   -Will follow.   -Normal Mag level  Obesity -Body mass index is 36.44 kg/m..  -Weight loss should be encouraged -Outpatient PCP/bariatric medicine/bariatric surgery f/u encouraged       Consultants: GI   Procedures: EGD 8/20 Colonoscopy planned for 8/22   Antibiotics: Ceftriaxone 8/19-20 -> PO Vantin at time of dc through 8/23    30 Day Unplanned Readmission Risk Score    Flowsheet Row ED to Hosp-Admission (Current) from 06/06/2023 in [facility]  30 Day Unplanned Readmission Risk Score (%) 9.21 Filed at 06/08/2023 0400       This score is the patient's risk of an unplanned readmission within 30 days of being discharged (0 -100%). The score is based on dignosis, age, lab data, medications, orders, and past utilization.   Low:  0-14.9    Medium: 15-21.9   High: 22-29.9   Extreme: 30 and above           Subjective: Feeling better, understands the gravity of the situation and the need for absolute ETOH cessation.  Would like very much to go home tonight, if possible.   Objective: Vitals:   06/08/23 0755 06/08/23 1129  BP:  123/84  Pulse:  80  Resp:  18  Temp: 98.1 F (36.7 C) 97.8 F (36.6 C)  SpO2:  96%    Intake/Output Summary (Last 24 hours) at 06/08/2023 1328 Last data filed at 06/08/2023 1135 Gross per 24 hour  Intake 2499.47 ml  Output 0 ml  Net 2499.47 ml   Filed Weights   06/06/23 0901 06/07/23 0500 06/07/23 1359  Weight: 124.7 kg 125.3 kg 125.3 kg    Exam:  General:  Appears calm and comfortable and is in NAD Eyes:   EOMI, normal lids, iris; +scleral icterus ENT:  grossly normal hearing, lips & tongue, mmm; appropriate dentition Neck:  no LAD, masses or thyromegaly Cardiovascular:  RRR, no m/r/g. No LE edema.  Respiratory:   CTA bilaterally with no wheezes/rales/rhonchi.  Normal respiratory effort. Abdomen:  soft, NT, ND Skin:  no rash or induration seen on limited exam; + varicosities of BLE Musculoskeletal:  grossly normal tone BUE/BLE, good ROM, no bony abnormality Psychiatric:  grossly normal mood and affect, speech fluent and appropriate, AOx3 Neurologic:  CN 2-12 grossly intact, moves all extremities in coordinated fashion  Data Reviewed: I have reviewed the patient's lab results since admission.  Pertinent labs for today include:  Na++ 129 Glucose 159 AP 219 Albumin 3.0 AST 101 Bili 5.3, 5.6 yesterday WBC 3 Hgb 7.4 - stable     Family Communication: Wife was present throughout evaluation  Disposition: Status is: Inpatient Remains inpatient appropriate because: ongoing evaluation/treatment  Planned Discharge Destination: Home    Time spent: 50 minutes  Author: Jonah Blue, MD 06/08/2023 1:28 PM  For on call review [email or web address].

## 2023-06-08 NOTE — Plan of Care (Signed)
  Problem: Education: Goal: Knowledge of General Education information will improve Description: Including pain rating scale, medication(s)/side effects and non-pharmacologic comfort measures Outcome: Progressing   Problem: Nutrition: Goal: Adequate nutrition will be maintained Outcome: Progressing   

## 2023-06-08 NOTE — Progress Notes (Addendum)
Subjective: No abdominal pain. He had a small melanotic stool during our encounter with noted BRB in the toilet. No nausea or vomiting.   Objective: Vital signs in last 24 hours: Temp:  [98 F (36.7 C)-98.5 F (36.9 C)] 98.1 F (36.7 C) (08/21 0755) Pulse Rate:  [78-89] 83 (08/21 0400) Resp:  [14-22] 17 (08/21 0000) BP: (118-140)/(58-76) 118/60 (08/21 0400) SpO2:  [95 %-99 %] 95 % (08/21 0000) Weight:  [125.3 kg] 125.3 kg (08/20 1359) Last BM Date : 06/06/23 General:   Alert and oriented, pleasant Head:  Normocephalic and atraumatic. Eyes:  scleral icterus   Mouth:  Without lesions, mucosa pink and moist.  Heart:  S1, S2 present, no murmurs noted.  Lungs: Clear to auscultation bilaterally, without wheezing, rales, or rhonchi.  Abdomen:  Bowel sounds present, soft, non-tender, non-distended. No HSM or hernias noted. No rebound or guarding. No masses appreciated  Msk:  Symmetrical without gross deformities. Normal posture. Pulses:  Normal pulses noted. Extremities:  Without clubbing or edema. Neurologic:  Alert and  oriented x4;  grossly normal neurologically. Skin:  Warm and dry, intact without significant lesions.  Psych:  Alert and cooperative. Normal mood and affect.  Intake/Output from previous day: 08/20 0701 - 08/21 0700 In: 2526 [P.O.:300; I.V.:1686.2; Blood:348; IV Piggyback:191.8] Out: 0  Intake/Output this shift: Total I/O In: 240 [P.O.:240] Out: -   Lab Results: Recent Labs    06/06/23 0930 06/06/23 2244 06/07/23 0454 06/07/23 1559 06/08/23 0827  WBC 3.4*  --  3.4*  --  3.0*  HGB 4.9*   < > 7.0* 7.5* 7.4*  HCT 16.7*   < > 22.8* 24.2* 24.7*  PLT 288  --  279  --  271   < > = values in this interval not displayed.   BMET Recent Labs    06/06/23 0930 06/07/23 0454 06/08/23 0827  NA 125* 129* 129*  K 2.9* 4.3 4.2  CL 88* 96* 99  CO2 27 25 23   GLUCOSE 108* 132* 159*  BUN 18 17 14   CREATININE 1.11 1.11 1.18  CALCIUM 8.8* 8.5* 8.4*    LFT Recent Labs    06/06/23 0930 06/07/23 0454 06/08/23 0827  PROT 8.3* 7.9 7.6  ALBUMIN 3.2* 3.1* 3.0*  AST 69* 99* 101*  ALT 32 34 37  ALKPHOS 256* 235* 219*  BILITOT 4.2* 5.6* 5.3*   PT/INR Recent Labs    06/06/23 1043 06/07/23 0454  LABPROT 14.9 15.3*  INR 1.2 1.2   Hepatitis Panel Recent Labs    06/06/23 2244  HEPBSAG NON REACTIVE  HCVAB NON REACTIVE  HEPAIGM NON REACTIVE  HEPBIGM NON REACTIVE   Studies/Results: US Abdomen Complete  Result Date: 06/07/2023 CLINICAL DATA:  Elevated liver function tests. EXAM: ABDOMEN ULTRASOUND COMPLETE COMPARISON:  None Available. FINDINGS: Gallbladder: No gallstones or wall thickening visualized. No sonographic Murphy sign noted by sonographer. Common bile duct: Not visualized. Liver: Heterogeneous appearance is noted which may represent hepatic steatosis, but focal lesions cannot be excluded. Portal vein is patent on color Doppler imaging with normal direction of blood flow towards the liver. IVC: No abnormality visualized. Pancreas: Visualized portion is unremarkable. Spleen: Measures 17.1 x 7.9 x 7.6 cm for calculated volume of 1344 cc consistent with severe splenomegaly. Right Kidney: Length: 11 cm. Echogenicity within normal limits. No mass or hydronephrosis visualized. Left Kidney: Length: 13.5 cm. Echogenicity within normal limits. No mass or hydronephrosis visualized. Abdominal aorta: No aneurysm visualized. Other findings: Recanalized umbilical vein is noted suggesting portal  hypertension. IMPRESSION: Heterogeneous echogenicity of hepatic parenchyma is noted suggesting hepatic steatosis or other diffuse hepatocellular disease, but focal lesions cannot be excluded. Further evaluation with CT scan is recommended. Moderate splenomegaly is noted as well as recanalized umbilical vein suggesting portal venous hypertension. Electronically Signed   By: Lupita Raider M.D.   On: 06/07/2023 13:39    Assessment: Antonio Allen is a  46 year old male with history of GERD, alcohol abuse who presented to the ED on Monday after PCP sent him in for a heart murmur.  Found to have hemoglobin of 4.9 on arrival, GI consulted for further evaluation.  Acute blood loss anemia: Reported melena consistently for the past month and intermittent low-volume hematochezia in setting of NSAID and alcohol abuse. He reports BRBPR can sometimes not happen for several weeks.  Hemoglobin 4.9 on admission.  Octreotide was started in the ED out of abundance of caution.  EGD yesterday with gastritis with hemorrhage, treated with APC.  Also a duodenitis.    Hgb 7.4 this morning/7.5 yesterday.  Can DC octreotide drip given no EV's. Continue PPI twice daily.  Rocephin 2 g prophylaxis/vantin as outpatient to complete 5 days total of SBP prophylaxis given concern for possible underlying cirrhosis.  Patient had a small BM during our encounter with BRB noted in the toilet. This was his first BM since Sunday. Small amount of stool that was melanotic which is to be expected for the next couple of days.,Though ongoing BRB is concerning, especially in conjunction with borderline hemoglobin this morning. Has had previous colonoscopy at age 49 for rectal bleeding, diagnosed with hemorrhoids then. Unfortunately cannot rule out other sources of bleeding in the LGI tract at this time. Recommend proceeding with inpatient colonoscopy tomorrow for further evaluation. He did eat breakfast this morning around 730 but no other solids since. Will start clear liquid diet, NPO at midnight and prep for colonoscopy tomorrow. Indications, risks and benefits of procedure discussed in detail with patient. Patient verbalized understanding and is in agreement to proceed with Colonoscopy.  Abnormal LFTs: In setting of alcohol use.  DF 15.9 (with PT control 13 from yesterday), no steroids warranted at this time. possibly a component of alcoholic hepatitis.  Ultrasound done yesterday suggestive of  portal venous hypertension, heterogeneous echogenicity of hepatic parenchyma suggesting hepatic steatosis/hepatocellular disease, would favor cirrhosis in setting of portal venous hypertension.  Bilirubin improved to 5.3 today, alkaline phosphatase down to 219.  AST remains elevated at 101, ALT 37.  Acute hepatitis panel on 8/19 was negative  Plan: Continue PPI BID Can stop octreotide Rocephin 2g-can transition to vantin outpatient to complete 5 days total Further workup of suspected cirrhosis as outpatient Alcohol cessation NSAID avoidance Monitor for overt GI bleeding, trend H&H Clear liquid diet, will make NPO at midnight in preparation for Colonoscopy tomorrow     LOS: 2 days    06/08/2023, 10:19 AM  Chelsea L. Jeanmarie Hubert, MSN, APRN, AGNP-C Adult-Gerontology Nurse Practitioner [facility]   Attending note: Agree with further GI evaluation via colonoscopy.  If it is unrevealing, would have a low threshold for looking back in his upper GI tract in the same setting.  Electrolyte management per attending.

## 2023-06-09 ENCOUNTER — Encounter (INDEPENDENT_AMBULATORY_CARE_PROVIDER_SITE_OTHER): Payer: Self-pay

## 2023-06-09 ENCOUNTER — Inpatient Hospital Stay (HOSPITAL_COMMUNITY): Payer: BC Managed Care – PPO | Admitting: Anesthesiology

## 2023-06-09 ENCOUNTER — Encounter (HOSPITAL_COMMUNITY): Payer: Self-pay | Admitting: Internal Medicine

## 2023-06-09 ENCOUNTER — Encounter (HOSPITAL_COMMUNITY)
Admission: EM | Disposition: A | Discharge status: Home / Self Care | Payer: Self-pay | Source: Home / Self Care | Attending: Internal Medicine

## 2023-06-09 ENCOUNTER — Telehealth: Payer: Self-pay | Admitting: Gastroenterology

## 2023-06-09 DIAGNOSIS — K7030 Alcoholic cirrhosis of liver without ascites: Secondary | ICD-10-CM | POA: Diagnosis not present

## 2023-06-09 DIAGNOSIS — F1010 Alcohol abuse, uncomplicated: Secondary | ICD-10-CM | POA: Diagnosis not present

## 2023-06-09 DIAGNOSIS — D500 Iron deficiency anemia secondary to blood loss (chronic): Secondary | ICD-10-CM | POA: Diagnosis not present

## 2023-06-09 DIAGNOSIS — K921 Melena: Secondary | ICD-10-CM | POA: Diagnosis not present

## 2023-06-09 DIAGNOSIS — K922 Gastrointestinal hemorrhage, unspecified: Secondary | ICD-10-CM | POA: Diagnosis not present

## 2023-06-09 HISTORY — PX: COLONOSCOPY WITH PROPOFOL: SHX5780

## 2023-06-09 LAB — CBC WITH DIFFERENTIAL/PLATELET
Abs Immature Granulocytes: 0 10*3/uL (ref 0.00–0.07)
Basophils Absolute: 0.1 10*3/uL (ref 0.0–0.1)
Basophils Relative: 2 %
Eosinophils Absolute: 0.3 10*3/uL (ref 0.0–0.5)
Eosinophils Relative: 11 %
HCT: 24.8 % — ABNORMAL LOW (ref 39.0–52.0)
Hemoglobin: 7.5 g/dL — ABNORMAL LOW (ref 13.0–17.0)
Immature Granulocytes: 0 %
Lymphocytes Relative: 24 %
Lymphs Abs: 0.7 10*3/uL (ref 0.7–4.0)
MCH: 25.1 pg — ABNORMAL LOW (ref 26.0–34.0)
MCHC: 30.2 g/dL (ref 30.0–36.0)
MCV: 82.9 fL (ref 80.0–100.0)
Monocytes Absolute: 0.4 10*3/uL (ref 0.1–1.0)
Monocytes Relative: 13 %
Neutro Abs: 1.4 10*3/uL — ABNORMAL LOW (ref 1.7–7.7)
Neutrophils Relative %: 50 %
Platelets: 256 10*3/uL (ref 150–400)
RBC: 2.99 MIL/uL — ABNORMAL LOW (ref 4.22–5.81)
RDW: 16.3 % — ABNORMAL HIGH (ref 11.5–15.5)
WBC: 2.9 10*3/uL — ABNORMAL LOW (ref 4.0–10.5)
nRBC: 0 % (ref 0.0–0.2)

## 2023-06-09 LAB — COMPREHENSIVE METABOLIC PANEL
ALT: 34 U/L (ref 0–44)
AST: 79 U/L — ABNORMAL HIGH (ref 15–41)
Albumin: 2.9 g/dL — ABNORMAL LOW (ref 3.5–5.0)
Alkaline Phosphatase: 196 U/L — ABNORMAL HIGH (ref 38–126)
Anion gap: 7 (ref 5–15)
BUN: 11 mg/dL (ref 6–20)
CO2: 22 mmol/L (ref 22–32)
Calcium: 8.2 mg/dL — ABNORMAL LOW (ref 8.9–10.3)
Chloride: 102 mmol/L (ref 98–111)
Creatinine, Ser: 1.07 mg/dL (ref 0.61–1.24)
GFR, Estimated: >60 mL/min (ref >60)
Glucose, Bld: 102 mg/dL — ABNORMAL HIGH (ref 70–99)
Potassium: 3.7 mmol/L (ref 3.5–5.1)
Sodium: 131 mmol/L — ABNORMAL LOW (ref 135–145)
Total Bilirubin: 5.5 mg/dL — ABNORMAL HIGH (ref 0.3–1.2)
Total Protein: 7.3 g/dL (ref 6.5–8.1)

## 2023-06-09 LAB — SURGICAL PATHOLOGY

## 2023-06-09 SURGERY — COLONOSCOPY WITH PROPOFOL
Anesthesia: General

## 2023-06-09 MED ORDER — ADULT MULTIVITAMIN W/MINERALS CH: 1.0000 | ORAL_TABLET | Freq: Every day | ORAL | 0 refills | Status: DC

## 2023-06-09 MED ORDER — PANTOPRAZOLE SODIUM 40 MG PO TBEC: 40.0000 mg | DELAYED_RELEASE_TABLET | Freq: Two times a day (BID) | ORAL | 0 refills | Status: DC

## 2023-06-09 MED ORDER — VITAMIN B-1 100 MG PO TABS: 100.0000 mg | ORAL_TABLET | Freq: Every day | ORAL | 0 refills | Status: DC

## 2023-06-09 MED ORDER — SODIUM CHLORIDE 0.9 % IV SOLN
200.0000 mg | INTRAVENOUS | Status: DC
  Administered 2023-06-09: 200 mg via INTRAVENOUS
  Filled 2023-06-09 (×2): qty 10

## 2023-06-09 MED ORDER — PROPOFOL 500 MG/50ML IV EMUL
INTRAVENOUS | Status: AC
  Filled 2023-06-09: qty 50

## 2023-06-09 MED ORDER — FERROUS SULFATE 324 MG PO TBEC: 324.0000 mg | DELAYED_RELEASE_TABLET | Freq: Every day | ORAL | 0 refills | Status: DC

## 2023-06-09 MED ORDER — DEXMEDETOMIDINE HCL IN NACL 80 MCG/20ML IV SOLN
INTRAVENOUS | Status: AC
  Filled 2023-06-09: qty 20

## 2023-06-09 MED ORDER — SODIUM CHLORIDE 0.9 % IV SOLN: INTRAVENOUS | Status: DC

## 2023-06-09 MED ORDER — PROPOFOL 10 MG/ML IV BOLUS
INTRAVENOUS | Status: DC | PRN
  Administered 2023-06-09: 40 mg via INTRAVENOUS
  Administered 2023-06-09: 100 mg via INTRAVENOUS
  Administered 2023-06-09: 30 mg via INTRAVENOUS

## 2023-06-09 MED ORDER — FOLIC ACID 1 MG PO TABS: 1.0000 mg | ORAL_TABLET | Freq: Every day | ORAL | 0 refills | Status: DC

## 2023-06-09 MED ORDER — LACTATED RINGERS IV SOLN: INTRAVENOUS | Status: DC

## 2023-06-09 MED ORDER — LIDOCAINE HCL (CARDIAC) PF 100 MG/5ML IV SOSY
PREFILLED_SYRINGE | INTRAVENOUS | Status: DC | PRN
  Administered 2023-06-09: 80 mg via INTRAVENOUS

## 2023-06-09 MED ORDER — PROPOFOL 500 MG/50ML IV EMUL
INTRAVENOUS | Status: DC | PRN
  Administered 2023-06-09: 150 ug/kg/min via INTRAVENOUS

## 2023-06-09 MED ORDER — CEFPODOXIME PROXETIL 200 MG PO TABS: 200.0000 mg | ORAL_TABLET | Freq: Two times a day (BID) | ORAL | 0 refills | Status: AC

## 2023-06-09 NOTE — Progress Notes (Addendum)
Progress Note   Patient: Antonio Allen UJW:[id number] DOB: Feb 19, 1976 DOA: 06/06/2023     3 DOS: the patient was seen and examined on 06/09/2023   Brief hospital course: No notes on file  Assessment and Plan:  Upper GI Bleeding -Patient presented with melena and DOE, suggestive of upper GI bleeding. -Has long-standing h/o heavy ETOH intake -The patient is not tachycardic with normal blood pressure, suggesting subacute volume loss.  -GI consulted -EGD 8/20 with hemorrhagic alcoholic gastritis and duodenitis, s/p APC -IVF stopped -On IV Pantaprazole, will need BID PPI at time of dc -On Octreotide but this was stopped since he does not have current evidence of varices -Zofran IV for nausea -Avoid NSAIDs and SQ heparin -Maintain IV access (2 large bore IVs if possible). -Hold ASA for now -Empiric Ceftriaxone should be given to those with Marjo Bicker B cirrhosis who are presenting with GI bleeding; on IV Rocephin 1 g daily, day 3; plan to complete 5 total days with transition to PO Vantin if dc prior to day 5 -Plan for colonoscopy per GI today.   ABLA -Patient's CP/SOB and fatigue are most likely caused by anemia secondary to upper GI bleeding.  -His Hgb was 4.9 on presentation, 7.5 this AM .  -s/p 4 units so far -GI reported that patient had rectal bleeding with fairly large amount of BRB in the commode this AM; plan for colonoscopy today. -Monitor closely and follow cbc q12h, transfuse as necessary for Hbg <7   ETOH dependence -Patient with chronic ETOH dependence -Number of drinks per day: 1 fifth vodka -Number of admissions for management of alcohol withdrawal syndrome (detox): 0 -History of withdrawal seizures, ICU admissions, DTs: no -CIWA protocol -Folate, thiamine, and MVI ordered -Will provide symptom-triggered BZD (ativan per CIWA protocol) only since the patient is able to communicate; is not showing current signs of delirium; and has no history of severe withdrawal. -TOC  team consulted -Elevated LFTs are likely related to alcoholism -Consider offering a medication for Alcohol Use Disorder at the time of d/c, to include Disulfuram; Naltrexone; or Acamprosate. -He is not interested in attending AA meetings or going to rehab at this time.   Cirrhosis Leukopenia -Hepatic steatosis vs. Cirrhosis on [place] with splenomegaly and possible portal venous HTN -MELD/MELD-Na score is 25, with a mortality rate of 19.6% -Child-Pugh category is B, with an 80% 1-year survival -Patient counseled that he needs to completely stop drinking alcohol -No current evidence of ascites -Hyponatremia is likely associated with this issue, slowly improving -Continue to monitor counts.  Hypokalemia -Repleted in ER.   -Potassium this morning 3.7. -Normal Mag level -Continue to monitor and replace as needed.  Obesity Obstructive sleep apnea? -Body mass index is 36.44 kg/m..  -Weight loss should be encouraged -Outpatient PCP/bariatric medicine/bariatric surgery f/u encouraged  -Discussed with the patient and his wife at the bedside.  Wife does admit that he snores at night but has never been officially diagnosed with obstructive sleep apnea.  Discussed with them and advised them to follow-up with their outpatient physician to have a formal sleep study. -Counseled on diet and lifestyle modifications.    Consultants: GI   Procedures: EGD 8/20 Colonoscopy planned for 8/22   Antibiotics: Ceftriaxone 8/19-20 -> PO Vantin at time of dc through 8/23    30 Day Unplanned Readmission Risk Score    Flowsheet Row ED to Hosp-Admission (Current) from 06/06/2023 in [facility]  30 Day Unplanned Readmission Risk Score (%) 9.21 Filed at  06/08/2023 0400       This score is the patient's risk of an unplanned readmission within 30 days of being discharged (0 -100%). The score is based on dignosis, age, lab data, medications, orders, and past utilization.   Low:  0-14.9    Medium: 15-21.9   High: 22-29.9   Extreme: 30 and above           Subjective: Feeling better, understands the gravity of the situation and the need for absolute ETOH cessation.  Denies having any abdominal pain, nausea, vomiting.  WBC count this morning low.  Hemoglobin 7.5.  Plan for colonoscopy today.   Objective: Vitals:   06/08/23 2356 06/09/23 0433  BP: 112/67 126/71  Pulse: 87 77  Resp: 18 18  Temp: 98.2 F (36.8 C) 98.4 F (36.9 C)  SpO2: 100% 97%    Intake/Output Summary (Last 24 hours) at 06/09/2023 0914 Last data filed at 06/09/2023 0500 Gross per 24 hour  Intake 1538.95 ml  Output --  Net 1538.95 ml   Filed Weights   06/06/23 0901 06/07/23 0500 06/07/23 1359  Weight: 124.7 kg 125.3 kg 125.3 kg    Exam: General:  Appears calm and comfortable and is in NAD Eyes:   EOMI, normal lids, iris; +scleral icterus ENT:  grossly normal hearing, lips & tongue, mmm; appropriate dentition Neck:  no LAD, masses or thyromegaly Cardiovascular:  RRR, no m/r/g. No LE edema.  Respiratory:   CTA bilaterally with no wheezes/rales/rhonchi.  Normal respiratory effort. Abdomen: Obese, soft, NT, ND Skin:  no rash or induration seen on limited exam; + varicosities of BLE Musculoskeletal:  grossly normal tone BUE/BLE, good ROM, no bony abnormality Psychiatric:  grossly normal mood and affect, speech fluent and appropriate, AOx3 Neurologic:  CN 2-12 grossly intact, moves all extremities in coordinated fashion  Data Reviewed: I have reviewed the patient's lab results since admission.  Pertinent labs for today include:  Na++ 129 Glucose 159 AP 219 Albumin 3.0 AST 101 Bili 5.3, 5.6 yesterday WBC 3 Hgb 7.4 - stable     Family Communication: Wife was present throughout evaluation  Disposition: Status is: Inpatient Remains inpatient appropriate because: ongoing evaluation/treatment  Planned Discharge Destination: Home  Time spent: 50 minutes  Author: Vonzella Nipple,  MD 06/09/2023 9:14 AM  For on call review [email or web address].

## 2023-06-09 NOTE — Plan of Care (Signed)
  Problem: Education: Goal: Knowledge of General Education information will improve Description: Including pain rating scale, medication(s)/side effects and non-pharmacologic comfort measures Outcome: Adequate for Discharge   Problem: Health Behavior/Discharge Planning: Goal: Ability to manage health-related needs will improve Outcome: Adequate for Discharge   Problem: Clinical Measurements: Goal: Ability to maintain clinical measurements within normal limits will improve Outcome: Adequate for Discharge Goal: Will remain free from infection Outcome: Adequate for Discharge Goal: Diagnostic test results will improve Outcome: Adequate for Discharge Goal: Respiratory complications will improve Outcome: Adequate for Discharge Goal: Cardiovascular complication will be avoided Outcome: Adequate for Discharge   Problem: Activity: Goal: Risk for activity intolerance will decrease Outcome: Adequate for Discharge   Problem: Coping: Goal: Level of anxiety will decrease Outcome: Adequate for Discharge   Problem: Elimination: Goal: Will not experience complications related to bowel motility Outcome: Adequate for Discharge Goal: Will not experience complications related to urinary retention Outcome: Adequate for Discharge   Problem: Pain Managment: Goal: General experience of comfort will improve Outcome: Adequate for Discharge   Problem: Safety: Goal: Ability to remain free from injury will improve Outcome: Adequate for Discharge   Problem: Skin Integrity: Goal: Risk for impaired skin integrity will decrease Outcome: Adequate for Discharge   

## 2023-06-09 NOTE — Anesthesia Preprocedure Evaluation (Signed)
Anesthesia Evaluation  Patient identified by MRN, date of birth, ID band Patient awake    Reviewed: Allergy & Precautions, H&P , NPO status , Patient's Chart, lab work & pertinent test results, reviewed documented beta blocker date and time   Airway Mallampati: II  TM Distance: >3 FB Neck ROM: full    Dental no notable dental hx.    Pulmonary neg pulmonary ROS, Patient abstained from smoking.   Pulmonary exam normal breath sounds clear to auscultation       Cardiovascular Exercise Tolerance: Good negative cardio ROS  Rhythm:regular Rate:Normal     Neuro/Psych negative neurological ROS  negative psych ROS   GI/Hepatic negative GI ROS, Neg liver ROS,GERD  ,,  Endo/Other  negative endocrine ROS    Renal/GU negative Renal ROS  negative genitourinary   Musculoskeletal   Abdominal   Peds  Hematology negative hematology ROS (+) Blood dyscrasia, anemia   Anesthesia Other Findings   Reproductive/Obstetrics negative OB ROS                             Anesthesia Physical Anesthesia Plan  ASA: 4 and emergent  Anesthesia Plan: General   Post-op Pain Management:    Induction:   PONV Risk Score and Plan: Propofol infusion  Airway Management Planned:   Additional Equipment:   Intra-op Plan:   Post-operative Plan:   Informed Consent: I have reviewed the patients History and Physical, chart, labs and discussed the procedure including the risks, benefits and alternatives for the proposed anesthesia with the patient or authorized representative who has indicated his/her understanding and acceptance.     Dental Advisory Given  Plan Discussed with: CRNA  Anesthesia Plan Comments:        Anesthesia Quick Evaluation

## 2023-06-09 NOTE — Transfer of Care (Signed)
Immediate Anesthesia Transfer of Care Note  Patient: Antonio Allen  Procedure(s) Performed: COLONOSCOPY WITH PROPOFOL  Patient Location: PACU  Anesthesia Type:General  Level of Consciousness: drowsy and patient cooperative  Airway & Oxygen Therapy: Patient Spontanous Breathing and Patient connected to face mask oxygen  Post-op Assessment: Report given to RN and Post -op Vital signs reviewed and stable  Post vital signs: Reviewed and stable  Last Vitals:  Vitals Value Taken Time  BP 119/39 06/09/23 1230  Temp 36.7 C 06/09/23 1230  Pulse 87 06/09/23 1233  Resp 18 06/09/23 1233  SpO2 100 % 06/09/23 1233  Vitals shown include unfiled device data.  Last Pain:  Vitals:   06/09/23 1230  TempSrc:   PainSc: 0-No pain         Complications: No notable events documented.

## 2023-06-09 NOTE — Interval H&P Note (Signed)
History and Physical Interval Note:  06/09/2023 11:50 AM  Antonio Allen  has presented today for surgery, with the diagnosis of rectal bleeding, anemia.  The various methods of treatment have been discussed with the patient and family. After consideration of risks, benefits and other options for treatment, the patient has consented to  Procedure(s): COLONOSCOPY WITH PROPOFOL (N/A) as a surgical intervention.  The patient's history has been reviewed, patient examined, no change in status, stable for surgery.  I have reviewed the patient's chart and labs.  Questions were answered to the patient's satisfaction.     [name] [name]

## 2023-06-09 NOTE — Progress Notes (Signed)
Dr. Tasia Catchings spoke with patient in the recovery room at length about his colonoscopy results.  Patient was awake and alert.  Patient sitting up in bed. Patient has already had a gingerale in pacu. Asked the doctor about speaking to wife but he said to give her the detailed report he printed and everything was explained there.  Upon returning patient to his room, with the patients permission, gave his wife the report to read.  No questions or concerns.

## 2023-06-09 NOTE — H&P (Signed)
 We will proceed with Colonoscopy as scheduled.    I thoroughly discussed with the patient the procedure, including the risks involved. Patient understands what the procedure involves including the benefits and any risks. Patient understands alternatives to the proposed procedure. Risks including (but not limited to) bleeding, tearing of the lining (perforation), rupture of adjacent organs, problems with heart and lung function, infection, and medication reactions. A small percentage of complications may require surgery, hospitalization, repeat endoscopic procedure, and/or transfusion.  Patient understood and agreed.

## 2023-06-09 NOTE — Telephone Encounter (Signed)
Per Dr. Tasia Catchings, patient needs a hospital follow up ov with Dr. Tasia Catchings in 2 weeks.

## 2023-06-09 NOTE — Progress Notes (Addendum)
Patient underwent Colonoscopy under propofol sedation.  Tolerated the procedure adequately.   FINDINGS:  - No evidence of old or active bleeding throughout the colon and 10 cm into Ileum - Diverticulosis in the left colon.  - Non- bleeding external and internal hemorrhoids.  - No specimens collected.  RECOMMENDATIONS  - May consider capsule endoscopy as outpatient if Anemia does not improve with Iron replacement  - Consider IV Iron loading while inpatient  - Send ANA, ASMA, AMA Total IgG and Ceruoplasmin, AFP for completion of workup of Cirrhosis - Repeat colonoscopy in 5 years for screening purposes given fair prep .  - Continue to follow as outpatient with GI for workup of newly diagnosed Cirrhotic and Severe iron deficiency anemia  -Restart diet  -Patient would need q6 months HCC Screening with Ultrasound and AFP - Absolute ETOH cessation  -Patient need CT Abdomen triple phase for HCC Screening as ultrasound was suboptimal , can be done as outpatient   Antonio Lawman, MD Gastroenterology and Hepatology [facility] Gastroenterology

## 2023-06-09 NOTE — Op Note (Signed)
[facility] Patient Name: Antonio Allen Procedure Date: 06/09/2023 11:45 AM MRN: [id number] Date of Birth: [birth year] Attending MD: Sanjuan Dame , MD, [id number] CSN: [id number] Age: 47 Admit Type: Inpatient Procedure:                Colonoscopy Indications:              Hematochezia, Iron deficiency anemia secondary to                            chronic blood loss Providers:                Sanjuan Dame, MD, Angelica Ran, Elinor Parkinson,                            Dyann Ruddle Referring MD:              Medicines:                Monitored Anesthesia Care Complications:            No immediate complications. Estimated Blood Loss:     Estimated blood loss: none. Procedure:                Pre-Anesthesia Assessment:                           - Prior to the procedure, a History and Physical                            was performed, and patient medications and                            allergies were reviewed. The patient's tolerance of                            previous anesthesia was also reviewed. The risks                            and benefits of the procedure and the sedation                            options and risks were discussed with the patient.                            All questions were answered, and informed consent                            was obtained. Prior Anticoagulants: The patient has                            taken no anticoagulant or antiplatelet agents. ASA                            Grade Assessment: III - A patient with severe                            systemic  disease. After reviewing the risks and                            benefits, the patient was deemed in satisfactory                            condition to undergo the procedure.                           After obtaining informed consent, the colonoscope                            was passed under direct vision. Throughout the                            procedure, the patient's blood  pressure, pulse, and                            oxygen saturations were monitored continuously. The                            [id number]) scope was introduced through the                            anus and advanced to the 10 cm into the ileum. The                            colonoscopy was performed without difficulty. The                            patient tolerated the procedure well. The quality                            of the bowel preparation was evaluated using the                            BBPS [facility] Bowel Preparation Scale) with scores                            of: Right Colon = 2 (minor amount of residual                            staining, small fragments of stool and/or opaque                            liquid, but mucosa seen well), Transverse Colon = 2                            (minor amount of residual staining, small fragments                            of stool and/or opaque liquid, but mucosa seen  well) and Left Colon = 2 (minor amount of residual                            staining, small fragments of stool and/or opaque                            liquid, but mucosa seen well). The total BBPS score                            equals 6. The terminal ileum, ileocecal valve,                            appendiceal orifice, and rectum were photographed. Scope In: 12:02:18 PM Scope Out: 12:25:49 PM Scope Withdrawal Time: 0 hours 16 minutes 19 seconds  Total Procedure Duration: 0 hours 23 minutes 31 seconds  Findings:      There is no endoscopic evidence of bleeding, mass, polyps or ulcerations       in the entire colon.      There is no endoscopic evidence of bleeding or inflammation at 10 cm       from the IC valve.      A few diverticula were found in the left colon.      Non-bleeding external and internal hemorrhoids were found during       retroflexion. Impression:               -No evidence of old or active bleeding throughout                             the colon and 10 cm into Ileum                           - Diverticulosis in the left colon.                           - Non-bleeding external and internal hemorrhoids.                           - No specimens collected. Moderate Sedation:      Per Anesthesia Care Recommendation:           - Repeat colonoscopy in 5 years for screening                            purposes given fair prep .                           -Continue to follow as outpatient with GI for                            workup of newly diagnosed Cirrhotic and Severe iron                            deficiency anemia                           -  May consider capsule endoscopy as outpatient if                            Anemia does not improve with Iron replacement                           - Consider IV Iron loading while inpatient                           - Send ANA, ASMA, AMA Total IgG and                            Ceruoplasmin,AFP for completion of workup of                            Cirrhosis Procedure Code(s):        --- Professional ---                           [phone], Colonoscopy, flexible; diagnostic, including                            collection of specimen(s) by brushing or washing,                            when performed (separate procedure) Diagnosis Code(s):        --- Professional ---                           K64.8, Other hemorrhoids                           K92.1, Melena (includes Hematochezia)                           D50.0, Iron deficiency anemia secondary to blood                            loss (chronic)                           K57.30, Diverticulosis of large intestine without                            perforation or abscess without bleeding CPT copyright 2022 American Medical Association. All rights reserved. The codes documented in this report are preliminary and upon coder review may  be revised to meet current compliance requirements. Sanjuan Dame,  MD Sanjuan Dame, MD 06/09/2023 12:45:20 PM This report has been signed electronically. Number of Addenda: 0

## 2023-06-09 NOTE — Discharge Summary (Signed)
Physician Discharge Summary   Patient: Antonio Allen MRN: [id number] DOB: 1976-07-29  Admit date:     06/06/2023  Discharge date: 06/09/23  Discharge Physician: Vonzella Nipple   PCP: [name], MD   Recommendations at discharge:  Patient counseled quite extensively on alcohol abuse and advised to abstain from alcohol use.  Advised to avoid NSAIDs.  Will be discharged on PPI.  Emphasized that it is really important that he follows up in the GI clinic.  He will need to follow-up with his primary care physician to have his CBC/CMP monitored within a week from discharge.  Discharge Diagnoses: Principal Problem:   Gastrointestinal hemorrhage Active Problems:   ABLA (acute blood loss anemia)   Alcohol dependence (HCC)   Cirrhosis, alcoholic (HCC)   Hypokalemia   Chronic alcohol abuse   Iron deficiency anemia due to chronic blood loss  Hospital Course: Antonio Allen is a 47 y.o. male with medical history significant of GERD presented with chest pain/SOB. He reports several weeks of LE edema, progressive SOB, chest pain.  He has had periodic tarry stools, worse in the last few weeks.  He went fishing with his daughter 2 days ago and had to stop 5 times from the car to the pier.  His wife finally convinced him to go to the doctor.  He drinks a fifth of vodka daily.  He has tried to stop drinking multiple times in the past, has never gone to rehab.  +withdrawal symptoms in the past when not drinking but no seizures or overt DTs.    ER Course:  Exertional dyspnea x 2 weeks.  New murmur at PCP.  Pale tarry stools for weeks.  Drinks 1/2 fifth daily.  Hgb 4.9.  Bili 4.2.  GI consulted, given Octreotide and Protonix and admitted for further evaluation and management.  Following the hospital course in the problem list format: Upper GI Bleeding -Patient presented with melena and DOE, suggestive of upper GI bleeding. -Has long-standing h/o heavy ETOH intake -GI consulted -EGD 8/20 with  hemorrhagic alcoholic gastritis and duodenitis, s/p APC -IVF stopped -On IV Pantaprazole in the hospital but will be discharged on PPI twice daily per GI recommendations. -On Octreotide but this was stopped since he does not have current evidence of varices -Per GI empiric Ceftriaxone should be given to those with Marjo Bicker B cirrhosis who are presenting with GI bleeding; on IV Rocephin while in the hospital and discharged on Vantin to complete the treatment. -He underwent colonoscopy by GI today.  Per report no evidence of old or active bleeding throughout the colon and 10 cm into the ileum.  Noted to have diverticulosis of the left colon, nonbleeding external and internal hemorrhoids.  GI recommended IV iron loading while in the hospital.  He was given 1 dose of IV iron.  Discussed about further IV iron doses with the patient and his wife.  However, patient and his wife insisting on being discharged home today at any cause.  They said they cannot spend another day in the hospital.  Per GI may need to consider capsule endoscopy as outpatient if anemia does not improve with iron replacement. Discussed with the patient and his wife and patient advised to abstaining from NSAIDs use.  Discussed with the patient and his wife and emphasized that he will need to follow-up with his outpatient physician to have his labs monitored within a week.   ABLA -Patient's CP/SOB and fatigue are most likely caused by anemia secondary to upper  GI bleeding.  -His Hgb was 4.9 on presentation, 7.5 this AM .  -s/p 4 units PRBCs. -GI reported that patient had rectal bleeding with fairly large amount of BRB in the commode yesterday.  Colonoscopy as mentioned above.  Discussed with the patient and his wife and they are insisting on him being discharged home today at any cost.  Emphasized to them that it is really important that if he starts noticing any further GI bleeding he needs to come to the hospital to get reevaluated otherwise  we will follow-up with his primary care physician preferably within the next few days to have his labs checked including CBC, CMP.   ETOH dependence -Patient with chronic ETOH dependence -Number of drinks per day: 1 fifth vodka -He was placed on CIWA protocol and did not exhibit signs of withdrawal. -Elevated LFTs are likely related to alcoholism -Discussed about medication for Alcohol Use Disorder at the time of d/c, to include Disulfuram; Naltrexone; or Acamprosate. -He is not interested at this time.  He was counseled quite extensively on abstaining from alcohol abuse.  His wife is at the bedside at the time of conversation and this was discussed with her as well. Advised him to follow-up with his outpatient physician.   Cirrhosis Leukopenia -Hepatic steatosis vs. Cirrhosis on [place] with splenomegaly and possible portal venous HTN -MELD/MELD-Na score is 25, with a mortality rate of 19.6% -Child-Pugh category is B, with an 80% 1-year survival -Patient counseled that he needs to completely stop drinking alcohol -No current evidence of ascites -Hyponatremia is likely associated with this issue, improved. -Advised to follow-up with his outpatient physician to have his labs monitored in the outpatient setting.   Hypokalemia -Replaced.Normal Mag level -Continue to monitor and replace as needed.   Obesity Body mass index is 36.44 kg/m. Obstructive sleep apnea? -Body mass index is 36.44 kg/m..  -Weight loss should be encouraged -Outpatient PCP/bariatric medicine/bariatric surgery f/u encouraged  -Discussed with the patient and his wife at the bedside.  Wife does admit that he snores at night but has never been officially diagnosed with obstructive sleep apnea.  Discussed with them and advised them to follow-up with their outpatient physician to have a formal sleep study. -Counseled on diet and lifestyle modifications.    Consultants: GI   Procedures: EGD 8/20 Colonoscopy 8/22    Antibiotics: Received IV ceftriaxone, p.o. Vantin and discharged to complete treatment. Discharge plan of care discussed at length with the patient and his wife.  Answered their questions appropriately to the best of my knowledge.  They verbalized understanding and he is being discharged home in stable condition.  However, due to his complex medical problems he is high risk for rehospitalization.  Consultants: Gastroenterology Procedures performed: EGD/colonoscopy Disposition: Home Diet recommendation:  Regular diet DISCHARGE MEDICATION: Allergies as of 06/09/2023       Reactions   Bee Venom Swelling        Medication List     STOP taking these medications    ibuprofen 200 MG tablet Commonly known as: ADVIL   mupirocin ointment 2 % Commonly known as: BACTROBAN       TAKE these medications    cefpodoxime 200 MG tablet Commonly known as: VANTIN Take 1 tablet (200 mg total) by mouth 2 (two) times daily for 4 days.   folic acid 1 MG tablet Commonly known as: FOLVITE Take 1 tablet (1 mg total) by mouth daily. Start taking on: June 10, 2023   multivitamin with minerals Tabs  tablet Take 1 tablet by mouth daily. Start taking on: June 10, 2023   pantoprazole 40 MG tablet Commonly known as: Protonix Take 1 tablet (40 mg total) by mouth 2 (two) times daily.   thiamine 100 MG tablet Commonly known as: Vitamin B-1 Take 1 tablet (100 mg total) by mouth daily. Start taking on: June 10, 2023        Discharge Exam: Ceasar Mons Weights   06/07/23 0500 06/07/23 1359 06/09/23 1024  Weight: 125.3 kg 125.3 kg 125.3 kg   General:  Appears calm and comfortable and is in NAD Eyes:   EOMI, normal lids ENT:  grossly normal hearing, lips & tongue, mmm; appropriate dentition Neck:  no LAD, masses or thyromegaly Cardiovascular:  RRR, no m/r/g. No LE edema.  Respiratory:   CTA bilaterally with no wheezes/rales/rhonchi.  Normal respiratory effort. Abdomen: Obese, soft, NT,  ND Skin:  no rash or induration  Musculoskeletal:  grossly normal tone BUE/BLE, good ROM, no bony abnormality Psychiatric:  grossly normal mood and affect, speech fluent and appropriate, AOx3 Neurologic:  CN 2-12 grossly intact, moves all extremities in coordinated fashion  Condition at discharge: good  The results of significant diagnostics from this hospitalization (including imaging, microbiology, ancillary and laboratory) are listed below for reference.   Imaging Studies: DG HIP UNILAT WITH PELVIS 2-3 VIEWS RIGHT  Result Date: 06/08/2023 CLINICAL DATA:  Right hip pain for greater than 1 year EXAM: DG HIP (WITH OR WITHOUT PELVIS) 2-3V RIGHT COMPARISON:  None Available. FINDINGS: Prominent and asymmetric right hip osteoarthritis with prominent loss of articular space. Spurring and somewhat aspherical right femoral head morphology may predispose to femoroacetabular impingement. No additional significant abnormalities observed. IMPRESSION: 1. Prominent and asymmetric right hip osteoarthritis. Electronically Signed   By: Gaylyn Rong M.D.   On: 06/08/2023 14:17   US Abdomen Complete  Result Date: 06/07/2023 CLINICAL DATA:  Elevated liver function tests. EXAM: ABDOMEN ULTRASOUND COMPLETE COMPARISON:  None Available. FINDINGS: Gallbladder: No gallstones or wall thickening visualized. No sonographic Murphy sign noted by sonographer. Common bile duct: Not visualized. Liver: Heterogeneous appearance is noted which may represent hepatic steatosis, but focal lesions cannot be excluded. Portal vein is patent on color Doppler imaging with normal direction of blood flow towards the liver. IVC: No abnormality visualized. Pancreas: Visualized portion is unremarkable. Spleen: Measures 17.1 x 7.9 x 7.6 cm for calculated volume of 1344 cc consistent with severe splenomegaly. Right Kidney: Length: 11 cm. Echogenicity within normal limits. No mass or hydronephrosis visualized. Left Kidney: Length: 13.5 cm.  Echogenicity within normal limits. No mass or hydronephrosis visualized. Abdominal aorta: No aneurysm visualized. Other findings: Recanalized umbilical vein is noted suggesting portal hypertension. IMPRESSION: Heterogeneous echogenicity of hepatic parenchyma is noted suggesting hepatic steatosis or other diffuse hepatocellular disease, but focal lesions cannot be excluded. Further evaluation with CT scan is recommended. Moderate splenomegaly is noted as well as recanalized umbilical vein suggesting portal venous hypertension. Electronically Signed   By: Lupita Raider M.D.   On: 06/07/2023 13:39   DG Chest Port 1 View  Result Date: 06/06/2023 CLINICAL DATA:  Dyspnea on exertion EXAM: PORTABLE CHEST 1 VIEW COMPARISON:  CXR 11/25/22 FINDINGS: No pleural effusion. No pneumothorax. No focal airspace opacity. Cardiomegaly, likely similar to prior when accounting for differences in technique. No focal airspace opacity. No radiographically apparent displaced rib fractures. Visualized upper abdomen is unremarkable. IMPRESSION: No focal airspace opacity. Electronically Signed   By: Lorenza Cambridge M.D.   On: 06/06/2023 11:15  Microbiology: Results for orders placed or performed during the hospital encounter of 06/06/23  MRSA Next Gen by PCR, Nasal     Status: None   Collection Time: 06/06/23  1:29 PM   Specimen: Nasal Mucosa; Nasal Swab  Result Value Ref Range Status   MRSA by PCR Next Gen NOT DETECTED NOT DETECTED Final    Comment: (NOTE) The GeneXpert MRSA Assay (FDA approved for NASAL specimens only), is one component of a comprehensive MRSA colonization surveillance program. It is not intended to diagnose MRSA infection nor to guide or monitor treatment for MRSA infections. Test performance is not FDA approved in patients less than 69 years old. Performed at [facility], [address], [place], [place] [address]     Labs: CBC: Recent Labs  Lab 06/06/23 0930 06/06/23 2244 06/07/23 0454  06/07/23 1559 06/08/23 0827 06/08/23 1942 06/09/23 0419  WBC 3.4*  --  3.4*  --  3.0* 3.6* 2.9*  NEUTROABS  --   --   --   --  1.7 1.9 1.4*  HGB 4.9*   < > 7.0* 7.5* 7.4* 8.0* 7.5*  HCT 16.7*   < > 22.8* 24.2* 24.7* 27.1* 24.8*  MCV 76.6*  --  81.4  --  82.9 83.1 82.9  PLT 288  --  279  --  271 294 256   < > = values in this interval not displayed.   Basic Metabolic Panel: Recent Labs  Lab 06/06/23 0930 06/06/23 1043 06/07/23 0454 06/08/23 0827 06/09/23 0419  NA 125*  --  129* 129* 131*  K 2.9*  --  4.3 4.2 3.7  CL 88*  --  96* 99 102  CO2 27  --  25 23 22   GLUCOSE 108*  --  132* 159* 102*  BUN 18  --  17 14 11   CREATININE 1.11  --  1.11 1.18 1.07  CALCIUM 8.8*  --  8.5* 8.4* 8.2*  MG  --  1.8  --   --   --    Liver Function Tests: Recent Labs  Lab 06/06/23 0930 06/07/23 0454 06/08/23 0827 06/09/23 0419  AST 69* 99* 101* 79*  ALT 32 34 37 34  ALKPHOS 256* 235* 219* 196*  BILITOT 4.2* 5.6* 5.3* 5.5*  PROT 8.3* 7.9 7.6 7.3  ALBUMIN 3.2* 3.1* 3.0* 2.9*   CBG: No results for input(s): "GLUCAP" in the last 168 hours.  Discharge time spent: greater than 30 minutes.  Signed: Vonzella Nipple, MD [facility] 06/09/2023

## 2023-06-10 LAB — CERULOPLASMIN: Ceruloplasmin: 49.4 mg/dL — ABNORMAL HIGH (ref 16.0–31.0)

## 2023-06-10 LAB — ANTI-SMOOTH MUSCLE ANTIBODY, IGG: F-Actin IgG: 13 U (ref 0–19)

## 2023-06-10 LAB — ANA W/REFLEX IF POSITIVE: Anti Nuclear Antibody (ANA): NEGATIVE

## 2023-06-10 LAB — AFP TUMOR MARKER: AFP, Serum, Tumor Marker: <1.8 ng/mL (ref 0.0–6.9)

## 2023-06-10 LAB — IGG: IgG (Immunoglobin G), Serum: 2435 mg/dL — ABNORMAL HIGH (ref 603–1613)

## 2023-06-11 NOTE — Anesthesia Postprocedure Evaluation (Signed)
Anesthesia Post Note  Patient: Antonio Allen  Procedure(s) Performed: COLONOSCOPY WITH PROPOFOL  Patient location during evaluation: Phase II Anesthesia Type: General Level of consciousness: awake Pain management: pain level controlled Vital Signs Assessment: post-procedure vital signs reviewed and stable Respiratory status: spontaneous breathing and respiratory function stable Cardiovascular status: blood pressure returned to baseline and stable Postop Assessment: no headache and no apparent nausea or vomiting Anesthetic complications: no Comments: Late entry   No notable events documented.   Last Vitals:  Vitals:   06/09/23 1245 06/09/23 1334  BP: (!) 161/79 125/81  Pulse: 71 68  Resp: 18 18  Temp:  36.7 C  SpO2: 100% 100%    Last Pain:  Vitals:   06/09/23 1245  TempSrc:   PainSc: 0-No pain                 Windell Norfolk

## 2023-06-13 ENCOUNTER — Encounter (INDEPENDENT_AMBULATORY_CARE_PROVIDER_SITE_OTHER): Payer: Self-pay | Admitting: *Deleted

## 2023-06-16 ENCOUNTER — Encounter (HOSPITAL_COMMUNITY): Payer: Self-pay | Admitting: Gastroenterology

## 2023-06-17 ENCOUNTER — Encounter (HOSPITAL_COMMUNITY): Payer: Self-pay | Admitting: Gastroenterology

## 2023-06-21 ENCOUNTER — Ambulatory Visit (INDEPENDENT_AMBULATORY_CARE_PROVIDER_SITE_OTHER): Payer: BC Managed Care – PPO | Admitting: Gastroenterology

## 2023-06-21 ENCOUNTER — Encounter (INDEPENDENT_AMBULATORY_CARE_PROVIDER_SITE_OTHER): Payer: Self-pay | Admitting: Gastroenterology

## 2023-06-21 ENCOUNTER — Telehealth (INDEPENDENT_AMBULATORY_CARE_PROVIDER_SITE_OTHER): Payer: Self-pay | Admitting: Gastroenterology

## 2023-06-21 VITALS — BP 112/72 | HR 73 | Temp 98.2°F | Ht 73.0 in | Wt 260.3 lb

## 2023-06-21 DIAGNOSIS — R748 Abnormal levels of other serum enzymes: Secondary | ICD-10-CM | POA: Diagnosis not present

## 2023-06-21 DIAGNOSIS — K279 Peptic ulcer, site unspecified, unspecified as acute or chronic, without hemorrhage or perforation: Secondary | ICD-10-CM | POA: Insufficient documentation

## 2023-06-21 DIAGNOSIS — D500 Iron deficiency anemia secondary to blood loss (chronic): Secondary | ICD-10-CM | POA: Diagnosis not present

## 2023-06-21 DIAGNOSIS — K7030 Alcoholic cirrhosis of liver without ascites: Secondary | ICD-10-CM | POA: Diagnosis not present

## 2023-06-21 DIAGNOSIS — L299 Pruritus, unspecified: Secondary | ICD-10-CM

## 2023-06-21 MED ORDER — HYDROXYZINE HCL 10 MG PO TABS
10.0000 mg | ORAL_TABLET | Freq: Three times a day (TID) | ORAL | 0 refills | Status: DC | PRN
Start: 2023-06-21 — End: 2023-06-29

## 2023-06-21 NOTE — Progress Notes (Signed)
[name] , M.D. Gastroenterology & Hepatology [facility] Gastroenterology [address] [place], [place] [address] Primary Care Physician: [name], MD [address] [place] [address]  Chief Complaint:  ewly diagnosed cirrhotic, elevated liver enzymes and possible hepatic lesion, iron deficiency anemia (ferritin 13), painless hematochezia   History of Present Illness: Antonio Allen is a 47 y.o. male with GERD , alcohol use disorder , newly diagnosed cirrhosis with recent hospitalization for severe anemia hemoglobin 4.9 status post an upper endoscopy and colonoscopy who presents for evaluation of anemia and newly diagnosed cirrhotic, elevated liver enzymes and possible hepatic lesion, iron deficiency anemia (ferritin 13) and painless hematochezia   Patient reports that since being discharged from the hospital he has not had any alcohol.  Previously he was drinking vodka daily for many years.  Past 3 days he has been noticing fresh blood upon defecating.  Has occasional straining.  Patient reports generalized itching not responding to Benadryl use denies any herbal medications or new medications  The patient denies having any nausea, vomiting, fever, chills,  melena, hematemesis, abdominal distention, abdominal pain, diarrhea or weight loss.  Last EGD:06/07/2023  Patchy severe inflammation with hemorrhage characterized by adherent blood, congestion ( edema) , erythema and mucus was found in the gastric fundus and in the gastric body.  - Normal esophagus.  - Gastritis with hemorrhage.  Treated with argon plasma coagulation (APC).  - gastric ulcer , Scar in the gastric antrum.  Biopsied.  - Duodenitis.   A. GASTRIC, BIOPSY: Antral and oxyntic mucosa with reactive gastropathy. Negative for Helicobacter pylori.  Last Colonoscopy:06/09/2023  - No evidence of old or active bleeding throughout the colon and 10 cm into Ileum -  Diverticulosis in the left colon.  - Non- bleeding external and internal hemorrhoids.  - No specimens collected.  Negative ANA, ASMA AFP<1.8 1.8 IgG 2400 Ceruloplasmin elevated  Hep A IgM negative hep B nonexposed hep C negative.  HIV negative Ferritin 13 Hemoglobin 7.5 MCV 82  FHx: neg for any gastrointestinal/liver disease, no malignancies Social: Daily alcohol use.  20 days ago   Past Medical History: Past Medical History:  Diagnosis Date   Alcohol dependence (HCC)    Atypical mole 05/30/2018   moderate atypia on left upper arm   Atypical mole 05/30/2018   mild atypia on mid chest   GERD (gastroesophageal reflux disease)     Past Surgical History: Past Surgical History:  Procedure Laterality Date   BIOPSY  06/07/2023   Procedure: BIOPSY;  Surgeon: Dolores Frame, MD;  [facility];  Service: Gastroenterology;;   CERVICAL FUSION     C6   COLONOSCOPY WITH PROPOFOL N/A 06/09/2023   Procedure: COLONOSCOPY WITH PROPOFOL;  Surgeon: Franky Macho, MD;  Location: [facility];  Service: Endoscopy;  Laterality: N/A;   ESOPHAGOGASTRODUODENOSCOPY (EGD) WITH PROPOFOL N/A 06/07/2023   Procedure: ESOPHAGOGASTRODUODENOSCOPY (EGD) WITH PROPOFOL;  Surgeon: Dolores Frame, MD;  Location: [facility];  Service: Gastroenterology;  Laterality: N/A;   HOT HEMOSTASIS  06/07/2023   Procedure: HOT HEMOSTASIS (ARGON PLASMA COAGULATION/BICAP);  Surgeon: Marguerita Merles, Reuel Boom, MD;  Location: [facility];  Service: Gastroenterology;;   ORIF ANKLE FRACTURE Left 08/08/2013   Procedure: OPEN REDUCTION INTERNAL FIXATION (ORIF) LEFT ANKLE FRACTURE;  Surgeon: Jacki Cones, MD;  Location: [facility];  Service: Orthopedics;  Laterality: Left;   TONSILLECTOMY      Family History:No family history on file.  Social History: Social  History   Tobacco Use  Smoking Status Never   Passive exposure: Past  Smokeless Tobacco Never   Social History   Substance  and Sexual Activity  Alcohol Use Not Currently   Comment: 1 fifth of liquor daily   Social History   Substance and Sexual Activity  Drug Use No    Allergies: Allergies  Allergen Reactions   Bee Venom Swelling    Medications: Current Outpatient Medications  Medication Sig Dispense Refill   folic acid (FOLVITE) 1 MG tablet Take 1 tablet (1 mg total) by mouth daily. 30 tablet 0   pantoprazole (PROTONIX) 40 MG tablet Take 1 tablet (40 mg total) by mouth 2 (two) times daily. 60 tablet 0   thiamine (VITAMIN B-1) 100 MG tablet Take 1 tablet (100 mg total) by mouth daily. 30 tablet 0   ferrous sulfate 324 MG TBEC Take 1 tablet (324 mg total) by mouth daily with breakfast. (Patient not taking: Reported on 06/21/2023) 30 tablet 0   Multiple Vitamin (MULTIVITAMIN WITH MINERALS) TABS tablet Take 1 tablet by mouth daily. (Patient not taking: Reported on 06/21/2023) 30 tablet 0   No current facility-administered medications for this visit.    Review of Systems: GENERAL: negative for malaise, night sweats HEENT: No changes in hearing or vision, no nose bleeds or other nasal problems. NECK: Negative for lumps, goiter, pain and significant neck swelling RESPIRATORY: Negative for cough, wheezing CARDIOVASCULAR: Negative for chest pain, leg swelling, palpitations, orthopnea GI: SEE HPI MUSCULOSKELETAL: Negative for joint pain or swelling, back pain, and muscle pain. SKIN: Negative for lesions, rash HEMATOLOGY Negative for prolonged bleeding, bruising easily, and swollen nodes. ENDOCRINE: Negative for cold or heat intolerance, polyuria, polydipsia and goiter. NEURO: negative for tremor, gait imbalance, syncope and seizures. The remainder of the review of systems is noncontributory.   Physical Exam: BP 112/72 (BP Location: Left Arm, Patient Position: Sitting, Cuff Size: Large)   Pulse 73   Temp 98.2 F (36.8 C) (Oral)   Ht 6\' 1"  (1.854 m)   Wt 260 lb 4.8 oz (118.1 kg)   BMI 34.34 kg/m   GENERAL: The patient is AO x3, in no acute distress. HEENT: Head is normocephalic and atraumatic. EOMI are intact. Mouth is well hydrated and without lesions. NECK: Supple. No masses LUNGS: Clear to auscultation. No presence of rhonchi/wheezing/rales. Adequate chest expansion HEART: RRR, normal s1 and s2. ABDOMEN: Soft, nontender, no guarding, no peritoneal signs, and nondistended. BS +. No masses. EXTREMITIES: Without any cyanosis, clubbing, rash, lesions or edema. NEUROLOGIC: AOx3, no focal motor deficit. SKIN: no jaundice, no rashes   Imaging/Labs: as above     Latest Ref Rng & Units 06/09/2023    4:19 AM 06/08/2023    7:42 PM 06/08/2023    8:27 AM  CBC  WBC 4.0 - 10.5 K/uL 2.9  3.6  3.0   Hemoglobin 13.0 - 17.0 g/dL 7.5  8.0  7.4   Hematocrit 39.0 - 52.0 % 24.8  27.1  24.7   Platelets 150 - 400 K/uL 256  294  271    Lab Results  Component Value Date   IRON 19 (L) 06/06/2023   TIBC 515 (H) 06/06/2023   FERRITIN 13 (L) 06/06/2023    I personally reviewed and interpreted the available labs, imaging and endoscopic files.  Impression and Plan: Antonio Allen is a 47 y.o. male with GERD , alcohol use disorder , newly diagnosed cirrhosis with recent hospitalization for severe anemia hemoglobin 4.9 status post an  upper endoscopy and colonoscopy who presents for evaluation of anemia and newly diagnosed cirrhotic, elevated liver enzymes and possible hepatic lesion, iron deficiency anemia (ferritin 13)   #Painless hematochezia  Patient recently had upper endoscopy and colonoscopy, upper endoscopy peptic ulcer disease and patchy area of hemorrhage status post APC colonoscopy with diverticulosis and hemorrhoids. Patient is hemodynamically stable, bleeding likely lower GI from hemorrhoids  Will repeat stat CBC if lower than 7 or continued blood per rectum will send to ER for evaluation   #Cirrhosis  MELD 3.0: 20 at 06/09/2023  4:19 AM MELD-Na: 21 at 06/09/2023  4:19 AM Calculated  from: Serum Creatinine: 1.07 mg/dL at 3/66/4403  4:74 AM Serum Sodium: 131 mmol/L at 06/09/2023  4:19 AM Total Bilirubin: 5.5 mg/dL at 2/59/5638  7:56 AM Serum Albumin: 2.9 g/dL at 4/33/2951  8:84 AM INR(ratio): 1.2 at 06/07/2023  4:54 AM Age at listing (hypothetical): 46 years Sex: Male at 06/09/2023  4:19 AM   #Eitology : Likely ETOH ,   Negative ANA, ASMA AFP<1.8 1.8 IgG 2400 Ceruloplasmin elevated Hep A IgM negative hep B nonexposed hep C negative.  HIV negative  # Hepatic encephalopathy None - Avoid opiates or benzodiazepines   # Ascites - Low sodium diet None on imaging   # Esophageal varices - Last EGD 05/2023, no varices  # HCC screening - Last abdominal imaging 05/2023 - Last AFP <1.8  Questionable lesion on ultrasound , will get triple phase CT abdomen  Recommendation   - Schedule CT abdomen pelvis  - Check CBC, MELD labs - Reduce salt intake to <2 g per day - Can take Tylenol max of 2 g per day (650 mg q8h) for pain - Avoid NSAIDs for pain - Avoid eating raw oysters/shellfish - Absolute ETOH cessation  -Will check Hep A Total , Hep B sAb   #Elevated liver enzymes  Recent blood work with patient having hardcopy normalized AST ALT with alcohol cessation but alk phos 290  Will check AMA for completion of workup, given elevated IgG if liver enzymes do not normalize in future would consider liver biopsy  #Iron deficiency anemia Ferritin 13 Hemoglobin 7.5 MCV 82 Patient has fatigue and is symptomatic from severe iron deficiency  Continue taking p.o. iron and requesting hematology referral for iron infusion given severe iron deficiency  #Peptic ulcer diease upper endoscopy peptic ulcer disease and patchy area of hemorrhage status post APC   Use Protonix (pantoprazole) 40 mg PO BID. Will consider repeating upper endoscopy in 12 weeks after PPI Avoid all NSAIDs  All questions were answered.      [name], MD Gastroenterology and  Hepatology [facility] Gastroenterology   This chart has been completed using [facility] Dictation software, and while attempts have been made to ensure accuracy , certain words and phrases may not be transcribed as intended

## 2023-06-21 NOTE — Patient Instructions (Signed)
It was very nice to meet you today, as dicussed with will plan for the following :  1)  - Schedule ct ABDOMEN  - Reduce salt intake to <2 g per day - Can take Tylenol max of 2 g per day (650 mg q8h) for pain - Avoid NSAIDs for pain - Avoid eating raw oysters/shellfish  2) Iron supplementation  3)Blood work   4)  Use Protonix (pantoprazole) 40 mg PO BID.

## 2023-06-21 NOTE — Telephone Encounter (Signed)
PA for CT pending via [facility]

## 2023-06-22 ENCOUNTER — Telehealth (INDEPENDENT_AMBULATORY_CARE_PROVIDER_SITE_OTHER): Payer: Self-pay | Admitting: *Deleted

## 2023-06-22 NOTE — Telephone Encounter (Signed)
Fax from [facility] requesting surgical clearance. Having right total hip replacement. Form on your desk for review.

## 2023-06-22 NOTE — Telephone Encounter (Signed)
DR. Tasia Catchings filled out form and signed. I faxed and sent to medical records to be scanned.

## 2023-06-24 NOTE — Telephone Encounter (Signed)
Pt CT has been denied by insurance. Denial letter states It is not medically necessary to use this test to check the liver. There is another test, such as an ultrasound of the abdomen. Denial letter on your desk.  Please advise. Thank you.

## 2023-06-25 LAB — COMPREHENSIVE METABOLIC PANEL
AG Ratio: 0.9 (calc) — ABNORMAL LOW (ref 1.0–2.5)
ALT: 23 U/L (ref 9–46)
AST: 47 U/L — ABNORMAL HIGH (ref 10–40)
Albumin: 3.8 g/dL (ref 3.6–5.1)
Alkaline phosphatase (APISO): 250 U/L — ABNORMAL HIGH (ref 36–130)
BUN: 10 mg/dL (ref 7–25)
CO2: 25 mmol/L (ref 20–32)
Calcium: 9.2 mg/dL (ref 8.6–10.3)
Chloride: 101 mmol/L (ref 98–110)
Creat: 0.9 mg/dL (ref 0.60–1.29)
Globulin: 4.4 g/dL — ABNORMAL HIGH (ref 1.9–3.7)
Glucose, Bld: 112 mg/dL — ABNORMAL HIGH (ref 65–99)
Potassium: 4.1 mmol/L (ref 3.5–5.3)
Sodium: 135 mmol/L (ref 135–146)
Total Bilirubin: 5.5 mg/dL — ABNORMAL HIGH (ref 0.2–1.2)
Total Protein: 8.2 g/dL — ABNORMAL HIGH (ref 6.1–8.1)

## 2023-06-25 LAB — CBC
HCT: 30.2 % — ABNORMAL LOW (ref 38.5–50.0)
Hemoglobin: 9.5 g/dL — ABNORMAL LOW (ref 13.2–17.1)
MCH: 25.2 pg — ABNORMAL LOW (ref 27.0–33.0)
MCHC: 31.5 g/dL — ABNORMAL LOW (ref 32.0–36.0)
MCV: 80.1 fL (ref 80.0–100.0)
MPV: 11 fL (ref 7.5–12.5)
Platelets: 331 10*3/uL (ref 140–400)
RBC: 3.77 10*6/uL — ABNORMAL LOW (ref 4.20–5.80)
RDW: 18.8 % — ABNORMAL HIGH (ref 11.0–15.0)
WBC: 3.5 10*3/uL — ABNORMAL LOW (ref 3.8–10.8)

## 2023-06-25 LAB — MITOCHONDRIAL ANTIBODIES: Mitochondrial M2 Ab, IgG: 148.9 U — ABNORMAL HIGH (ref <=20.0)

## 2023-06-25 LAB — PROTIME-INR
INR: 1.1
Prothrombin Time: 11.7 s — ABNORMAL HIGH (ref 9.0–11.5)

## 2023-06-25 LAB — HEPATITIS A ANTIBODY, TOTAL: Hepatitis A AB,Total: REACTIVE — AB

## 2023-06-25 LAB — HEPATITIS B SURFACE ANTIBODY,QUALITATIVE: Hep B S Ab: NONREACTIVE

## 2023-06-25 NOTE — Telephone Encounter (Signed)
Hi Tanya  I will have a look at the letter ,but can we send following explanation:   Patient already had an ultrasound which demonstrated cirrhotic liver with possible lesion. Next step to evaluate a liver lesion in a cirrhotic patient is triple phase CT Abdomen  Results of ultrasound which itself suggest CT to be done: IMPRESSION: Heterogeneous echogenicity of hepatic parenchyma is noted suggesting hepatic steatosis or other diffuse hepatocellular disease, but focal lesions cannot be excluded. Further evaluation with CT scan is recommended.  Moderate splenomegaly is noted as well as recanalized umbilical vein suggesting portal venous hypertension.

## 2023-06-27 NOTE — Progress Notes (Signed)
  Mitzie: Patient is scheduled to see [place] in 3 months, please have patient seen by me in 4 to 6 weeks    Hi Toniann Fail ,  Can you please call the patient and tell the patient the lab work shows that his anemia is improving  Lab work with some abnormality concerning for PBC (primary biliary cholangitis).   At this time we will work on getting CT abdomen as previously discussed.  Also follow-up with primary care physician to be vaccinated against otitis B  Patient to follow-up with me in clinic  Thanks,  [name], MD Gastroenterology and Hepatology [facility] Gastroenterology -----------------------------  INR 1.1 Alk phos 250 AST 47 Hemoglobin 9.5 AMA very positive 48 Hepatitis A immune Hepatitis B has been nonimmune  Patient might need biopsy to make diagnosis of PBC

## 2023-06-27 NOTE — Telephone Encounter (Signed)
[place] results faxed to [phone].

## 2023-06-29 ENCOUNTER — Inpatient Hospital Stay: Payer: BC Managed Care – PPO | Attending: Oncology | Admitting: Oncology

## 2023-06-29 ENCOUNTER — Encounter (INDEPENDENT_AMBULATORY_CARE_PROVIDER_SITE_OTHER): Payer: Self-pay

## 2023-06-29 ENCOUNTER — Encounter: Payer: Self-pay | Admitting: Oncology

## 2023-06-29 VITALS — BP 117/66 | HR 61 | Temp 97.9°F | Resp 17 | Ht 73.0 in | Wt 249.1 lb

## 2023-06-29 DIAGNOSIS — R5383 Other fatigue: Secondary | ICD-10-CM | POA: Diagnosis not present

## 2023-06-29 DIAGNOSIS — K5900 Constipation, unspecified: Secondary | ICD-10-CM | POA: Insufficient documentation

## 2023-06-29 DIAGNOSIS — K7030 Alcoholic cirrhosis of liver without ascites: Secondary | ICD-10-CM | POA: Insufficient documentation

## 2023-06-29 DIAGNOSIS — K279 Peptic ulcer, site unspecified, unspecified as acute or chronic, without hemorrhage or perforation: Secondary | ICD-10-CM | POA: Insufficient documentation

## 2023-06-29 DIAGNOSIS — Z79899 Other long term (current) drug therapy: Secondary | ICD-10-CM | POA: Insufficient documentation

## 2023-06-29 DIAGNOSIS — Z8719 Personal history of other diseases of the digestive system: Secondary | ICD-10-CM | POA: Diagnosis not present

## 2023-06-29 DIAGNOSIS — R0609 Other forms of dyspnea: Secondary | ICD-10-CM | POA: Diagnosis not present

## 2023-06-29 DIAGNOSIS — K3189 Other diseases of stomach and duodenum: Secondary | ICD-10-CM | POA: Diagnosis not present

## 2023-06-29 DIAGNOSIS — Z91030 Bee allergy status: Secondary | ICD-10-CM | POA: Insufficient documentation

## 2023-06-29 DIAGNOSIS — D500 Iron deficiency anemia secondary to blood loss (chronic): Secondary | ICD-10-CM | POA: Insufficient documentation

## 2023-06-29 DIAGNOSIS — K2971 Gastritis, unspecified, with bleeding: Secondary | ICD-10-CM | POA: Diagnosis not present

## 2023-06-29 NOTE — Assessment & Plan Note (Signed)
The most likely cause of his anemia is due to chronic blood loss. Patient is s/p upper GI endoscopy and colonoscopy showing gastritis with hemorrhage and duodenitis.  Path consistent with erosive gastropathy. Patient is compliant with p.o. iron and takes over-the-counter stool softeners for constipation We discussed some of the risks, benefits, and alternatives of intravenous iron infusions. The patient is symptomatic from anemia and the iron level is critically low.Will recommend IV Iron to achieve higher levels of iron faster for adequate hematopoesis. Some of the side-effects to be expected including risks of infusion reactions, phlebitis, headaches, nausea and fatigue.  The patient is willing to proceed.  Goal is to keep ferritin level greater than 50 and resolution of anemia   RTC in 4 weeks with repeat labs

## 2023-06-29 NOTE — Patient Instructions (Signed)
Will schedule for IV Iron next week Continue to take PO Iron Return to clinic in 4 weeks

## 2023-06-29 NOTE — Addendum Note (Signed)
Addended by: Marlowe Shores on: 06/29/2023 03:19 PM   Modules accepted: Orders

## 2023-06-29 NOTE — Assessment & Plan Note (Signed)
Patient has a liver cirrhosis from chronic alcohol use We discussed that cirrhosis can cause platelet dysfunction and hence increased bleeding times  Patient has quit drinking alcohol at this time and is being followed by GI Patient is nonimmune for hepatitis B.  Recommended vaccination for hepatitis B

## 2023-06-29 NOTE — Assessment & Plan Note (Signed)
Patient has a gastritis and erosive gastropathy seen on recent upper GI endoscopy Patient is on pantoprazole and seems to think the symptoms have improved Continue to follow with GI

## 2023-06-29 NOTE — Telephone Encounter (Signed)
Patient is a cirrhotic which is a condition can give rise to hepatocellular carcinoma.  Patient had a recent ultrasound which demonstrated a possible lesion.  We need to rule out hepatocellular carcinoma and the ideal diagnostic imaging is CT abdomen with contrast hepatic protocol (triple phase) .  We can have CT abdomen without pelvis at this time

## 2023-06-29 NOTE — Progress Notes (Signed)
[facility] at [facility] HEMATOLOGY NEW VISIT  [name], MD  REASON FOR REFERRAL: Iron deficiency anemia  SUMMARY OF HEMATOLOGIC HISTORY:    Latest Ref Rng & Units 06/21/2023   10:00 AM 06/09/2023    4:19 AM 06/08/2023    7:42 PM  CBC  WBC 3.8 - 10.8 Thousand/uL 3.5  2.9  3.6   Hemoglobin 13.2 - 17.1 g/dL 9.5  7.5  8.0   Hematocrit 38.5 - 50.0 % 30.2  24.8  27.1   Platelets 140 - 400 Thousand/uL 331  256  294    Lab Results  Component Value Date   IRON 19 (L) 06/06/2023   TIBC 515 (H) 06/06/2023   FERRITIN 13 (L) 06/06/2023      HISTORY OF PRESENT ILLNESS: Antonio Allen 47 y.o. male referred by his gastroenterologist Dr. Tasia Catchings for Iron deficiency anemia.  He does not have significant past medical history apart from his recent diagnosis of cirrhosis.  He is accompanied by his wife today.  Patient was recently admitted to the hospital on 06/06/2023 after his visit to the primary care where they heard a murmur and referred him to the ER.  Blood test at that time showed a hemoglobin of 4.8.  Patient was significantly symptomatic at that time with dizziness, extreme fatigue, severe dyspnea on exertion.  He got 4 units of packed red blood cells IV iron sucrose 200 mg 1 dose.  He had an improvement in his hemoglobin to 7.5 at the time of discharge.  He also had upper GI endoscopy and colonoscopy done at that time showing gastritis.  He is being followed by GI and is being worked up for primary biliary cirrhosis.  Patient was drinking significant amounts of alcohol at least past 8 to 9 years, vodka every day.  He did not drink since his admission.  Patient is a non-smoker.  Lives in [place] with his wife and 2 daughters.  He has no family history of GI cancers.  Father died from melanoma.  Patient stated that his dyspnea on exertion has improved, fatigue has improved but he does have some fatigue and dyspnea at this time.  He denies chest pain, weight loss.   He still sees some dark-colored stools occasionally.  I have reviewed the past medical history, past surgical history, social history and family history with the patient   ALLERGIES:  is allergic to bee venom.  MEDICATIONS:  Current Outpatient Medications  Medication Sig Dispense Refill   amoxicillin (AMOXIL) 500 MG capsule Take 500 mg by mouth 3 (three) times daily.     ferrous sulfate 324 MG TBEC Take 1 tablet (324 mg total) by mouth daily with breakfast. 30 tablet 0   folic acid (FOLVITE) 1 MG tablet Take 1 tablet (1 mg total) by mouth daily. 30 tablet 0   hydrOXYzine (ATARAX) 25 MG tablet Take 25 mg by mouth every 6 (six) hours.     pantoprazole (PROTONIX) 40 MG tablet Take 1 tablet (40 mg total) by mouth 2 (two) times daily. 60 tablet 0   thiamine (VITAMIN B-1) 100 MG tablet Take 1 tablet (100 mg total) by mouth daily. 30 tablet 0   tiZANidine (ZANAFLEX) 4 MG tablet Take 4 mg by mouth every 6 (six) hours.     traMADol (ULTRAM) 50 MG tablet Take 50 mg by mouth every 6 (six) hours.     No current facility-administered medications for this visit.     REVIEW OF SYSTEMS:  Constitutional: Denies fevers, chills or night sweats Eyes: Denies blurriness of vision Ears, nose, mouth, throat, and face: Denies mucositis or sore throat Respiratory: Denies cough, dyspnea or wheezes Cardiovascular: Denies palpitation, chest discomfort or lower extremity swelling Gastrointestinal:  Denies nausea, heartburn or change in bowel habits Skin: Denies abnormal skin rashes Lymphatics: Denies new lymphadenopathy or easy bruising Neurological:Denies numbness, tingling or new weaknesses Behavioral/Psych: Mood is stable, no new changes  All other systems were reviewed with the patient and are negative.  PHYSICAL EXAMINATION:   Vitals:   06/29/23 0806  BP: 117/66  Pulse: 61  Resp: 17  Temp: 97.9 F (36.6 C)  SpO2: 100%    GENERAL:alert, no distress and comfortable SKIN: skin color, texture,  turgor are normal, no rashes or significant lesions EYES: normal, Conjunctiva are pink and non-injected, sclera clear OROPHARYNX:no exudate, no erythema and lips, buccal mucosa, and tongue normal  NECK: supple, thyroid normal size, non-tender, without nodularity LYMPH:  no palpable lymphadenopathy in the cervical, axillary or inguinal LUNGS: clear to auscultation and percussion with normal breathing effort HEART: regular rate & rhythm and no murmurs and no lower extremity edema ABDOMEN:abdomen soft, non-tender and normal bowel sounds Musculoskeletal:no cyanosis of digits and no clubbing  NEURO: alert & oriented x 3 with fluent speech, no focal motor/sensory deficits  LABORATORY DATA:  I have reviewed the data as listed  Lab Results  Component Value Date   WBC 3.5 (L) 06/21/2023   NEUTROABS 1.4 (L) 06/09/2023   HGB 9.5 (L) 06/21/2023   HCT 30.2 (L) 06/21/2023   MCV 80.1 06/21/2023   PLT 331 06/21/2023      Component Value Date/Time   NA 135 06/21/2023 1000   K 4.1 06/21/2023 1000   CL 101 06/21/2023 1000   CO2 25 06/21/2023 1000   GLUCOSE 112 (H) 06/21/2023 1000   BUN 10 06/21/2023 1000   CREATININE 0.90 06/21/2023 1000   CALCIUM 9.2 06/21/2023 1000   PROT 8.2 (H) 06/21/2023 1000   ALBUMIN 2.9 (L) 06/09/2023 0419   AST 47 (H) 06/21/2023 1000   ALT 23 06/21/2023 1000   ALKPHOS 196 (H) 06/09/2023 0419   BILITOT 5.5 (H) 06/21/2023 1000   GFRNONAA >60 06/09/2023 0419   GFRAA >90 08/08/2013 1000       Chemistry      Component Value Date/Time   NA 135 06/21/2023 1000   K 4.1 06/21/2023 1000   CL 101 06/21/2023 1000   CO2 25 06/21/2023 1000   BUN 10 06/21/2023 1000   CREATININE 0.90 06/21/2023 1000      Component Value Date/Time   CALCIUM 9.2 06/21/2023 1000   ALKPHOS 196 (H) 06/09/2023 0419   AST 47 (H) 06/21/2023 1000   ALT 23 06/21/2023 1000   BILITOT 5.5 (H) 06/21/2023 1000     Lab Results  Component Value Date   IRON 19 (L) 06/06/2023   TIBC 515 (H)  06/06/2023   FERRITIN 13 (L) 06/06/2023    ASSESSMENT & PLAN:  Patient is a 47 year old male with past medical history of alcohol use and recent diagnosis of cirrhosis referred for iron deficiency anemia  Iron deficiency anemia due to chronic blood loss The most likely cause of his anemia is due to chronic blood loss. Patient is s/p upper GI endoscopy and colonoscopy showing gastritis with hemorrhage and duodenitis.  Path consistent with erosive gastropathy. Patient is compliant with p.o. iron and takes over-the-counter stool softeners for constipation We discussed some of the risks, benefits, and  alternatives of intravenous iron infusions. The patient is symptomatic from anemia and the iron level is critically low.Will recommend IV Iron to achieve higher levels of iron faster for adequate hematopoesis. Some of the side-effects to be expected including risks of infusion reactions, phlebitis, headaches, nausea and fatigue.  The patient is willing to proceed.  Goal is to keep ferritin level greater than 50 and resolution of anemia   RTC in 4 weeks with repeat labs  Peptic ulcer disease Patient has a gastritis and erosive gastropathy seen on recent upper GI endoscopy Patient is on pantoprazole and seems to think the symptoms have improved Continue to follow with GI  Cirrhosis, alcoholic (HCC) Patient has a liver cirrhosis from chronic alcohol use We discussed that cirrhosis can cause platelet dysfunction and hence increased bleeding times  Patient has quit drinking alcohol at this time and is being followed by GI Patient is nonimmune for hepatitis B.  Recommended vaccination for hepatitis B   Orders Placed This Encounter  Procedures   CBC with Differential/Platelet    Standing Status:   Future    Standing Expiration Date:   06/28/2024   Ferritin    Standing Status:   Future    Standing Expiration Date:   06/28/2024   Iron and TIBC ([facility] ONLY)    Standing  Status:   Future    Standing Expiration Date:   06/28/2024    The total time spent in the appointment was 45 minutes encounter with patients including review of chart and various tests results, discussions about plan of care and coordination of care plan   All questions were answered. The patient knows to call the clinic with any problems, questions or concerns. No barriers to learning was detected.   Cindie Crumbly, MD 9/11/20248:55 AM

## 2023-06-29 NOTE — Telephone Encounter (Signed)
CT has been denied even after they had review additional information sent (ultrasound results). Denial letter placed on desk

## 2023-06-29 NOTE — Telephone Encounter (Signed)
Ct abdomen with contrast approved. Valid from 06/29/23-07/28/23. Contacted central scheduling and pt is scheduled for 07/08/23 at 5pm at [facility]. Pt is to arrive at 4:30pm. Contacted pt and pt verbalized understanding. Will send my chart reminder regarding appt.

## 2023-06-30 ENCOUNTER — Other Ambulatory Visit: Payer: Self-pay | Admitting: Oncology

## 2023-07-02 ENCOUNTER — Encounter (INDEPENDENT_AMBULATORY_CARE_PROVIDER_SITE_OTHER): Payer: Self-pay | Admitting: Gastroenterology

## 2023-07-04 ENCOUNTER — Inpatient Hospital Stay: Payer: BC Managed Care – PPO

## 2023-07-04 VITALS — BP 126/79 | HR 60 | Temp 98.7°F | Resp 18

## 2023-07-04 DIAGNOSIS — D500 Iron deficiency anemia secondary to blood loss (chronic): Secondary | ICD-10-CM | POA: Diagnosis not present

## 2023-07-04 MED ORDER — SODIUM CHLORIDE 0.9 % IV SOLN: Freq: Once | INTRAVENOUS | Status: AC

## 2023-07-04 MED ORDER — ACETAMINOPHEN 325 MG PO TABS
650.0000 mg | ORAL_TABLET | Freq: Once | ORAL | Status: AC
  Administered 2023-07-04: 650 mg via ORAL
  Filled 2023-07-04: qty 2

## 2023-07-04 MED ORDER — DIPHENHYDRAMINE HCL 25 MG PO CAPS
25.0000 mg | ORAL_CAPSULE | Freq: Once | ORAL | Status: AC
  Administered 2023-07-04: 25 mg via ORAL
  Filled 2023-07-04: qty 1

## 2023-07-04 MED ORDER — SODIUM CHLORIDE 0.9 % IV SOLN
400.0000 mg | Freq: Once | INTRAVENOUS | Status: AC
  Administered 2023-07-04: 400 mg via INTRAVENOUS
  Filled 2023-07-04: qty 20

## 2023-07-04 NOTE — Telephone Encounter (Signed)
Left message to return call to get more information. And note from Dr. Tasia Catchings said he should get hep b vaccine.

## 2023-07-04 NOTE — Patient Instructions (Signed)
[facility]  Discharge Instructions: Thank you for choosing [facility] to provide your oncology and hematology care.  If you have a lab appointment with the Cancer Center - please note that after April 8th, 2024, all labs will be drawn in the cancer center.  You do not have to check in or register with the main entrance as you have in the past but will complete your check-in in the cancer center.  Wear comfortable clothing and clothing appropriate for easy access to any Portacath or PICC line.   We strive to give you quality time with your provider. You may need to reschedule your appointment if you arrive late (15 or more minutes).  Arriving late affects you and other patients whose appointments are after yours.  Also, if you miss three or more appointments without notifying the office, you may be dismissed from the clinic at the provider's discretion.      For prescription refill requests, have your pharmacy contact our office and allow 72 hours for refills to be completed.    Today you received Venofer IV iron infusion.     BELOW ARE SYMPTOMS THAT SHOULD BE REPORTED IMMEDIATELY: *FEVER GREATER THAN 100.4 F (38 C) OR HIGHER *CHILLS OR SWEATING *NAUSEA AND VOMITING THAT IS NOT CONTROLLED WITH YOUR NAUSEA MEDICATION *UNUSUAL SHORTNESS OF BREATH *UNUSUAL BRUISING OR BLEEDING *URINARY PROBLEMS (pain or burning when urinating, or frequent urination) *BOWEL PROBLEMS (unusual diarrhea, constipation, pain near the anus) TENDERNESS IN MOUTH AND THROAT WITH OR WITHOUT PRESENCE OF ULCERS (sore throat, sores in mouth, or a toothache) UNUSUAL RASH, SWELLING OR PAIN  UNUSUAL VAGINAL DISCHARGE OR ITCHING   Items with * indicate a potential emergency and should be followed up as soon as possible or go to the Emergency Department if any problems should occur.  Please show the CHEMOTHERAPY ALERT CARD or IMMUNOTHERAPY ALERT CARD at check-in to the Emergency Department  and triage nurse.  Should you have questions after your visit or need to cancel or reschedule your appointment, please contact [facility] [phone]  and follow the prompts.  Office hours are 8:00 a.m. to 4:30 p.m. Monday - Friday. Please note that voicemails left after 4:00 p.m. may not be returned until the following business day.  We are closed weekends and major holidays. You have access to a nurse at all times for urgent questions. Please call the main number to the clinic [phone] and follow the prompts.  For any non-urgent questions, you may also contact your provider using MyChart. We now offer e-Visits for anyone 73 and older to request care online for non-urgent symptoms. For details visit [email or web address].   Also download the MyChart app! Go to the app store, search "MyChart", open the app, select , and log in with your MyChart username and password.

## 2023-07-04 NOTE — Progress Notes (Signed)
Patient presents today for iron infusion. Patient is in satisfactory condition with no new complaints voiced.  Vital signs are stable.  We will proceed with infusion per provider orders.    Peripheral IV started with good blood return pre and post infusion.  Venofer 400 mg given today per MD orders. Tolerated infusion without adverse affects. Vital signs stable. No complaints at this time. Discharged from clinic ambulatory in stable condition. Alert and oriented x 3. F/U with [facility] as scheduled.

## 2023-07-08 ENCOUNTER — Ambulatory Visit (HOSPITAL_BASED_OUTPATIENT_CLINIC_OR_DEPARTMENT_OTHER)
Admission: RE | Admit: 2023-07-08 | Discharge: 2023-07-08 | Disposition: A | Discharge status: Home / Self Care | Payer: BC Managed Care – PPO | Source: Ambulatory Visit | Attending: Gastroenterology | Admitting: Gastroenterology

## 2023-07-08 ENCOUNTER — Ambulatory Visit (HOSPITAL_COMMUNITY): Payer: BC Managed Care – PPO

## 2023-07-08 DIAGNOSIS — K7030 Alcoholic cirrhosis of liver without ascites: Secondary | ICD-10-CM | POA: Insufficient documentation

## 2023-07-08 MED ORDER — IOHEXOL 300 MG/ML  SOLN
100.0000 mL | Freq: Once | INTRAMUSCULAR | Status: AC | PRN
  Administered 2023-07-08: 100 mL via INTRAVENOUS

## 2023-07-11 ENCOUNTER — Inpatient Hospital Stay: Payer: BC Managed Care – PPO

## 2023-07-11 VITALS — BP 117/75 | HR 58 | Temp 98.8°F | Resp 18

## 2023-07-11 DIAGNOSIS — D500 Iron deficiency anemia secondary to blood loss (chronic): Secondary | ICD-10-CM

## 2023-07-11 MED ORDER — SODIUM CHLORIDE 0.9 % IV SOLN
400.0000 mg | Freq: Once | INTRAVENOUS | Status: AC
  Administered 2023-07-11: 400 mg via INTRAVENOUS
  Filled 2023-07-11: qty 400

## 2023-07-11 MED ORDER — SODIUM CHLORIDE 0.9 % IV SOLN: Freq: Once | INTRAVENOUS | Status: AC

## 2023-07-11 MED ORDER — DIPHENHYDRAMINE HCL 25 MG PO CAPS
25.0000 mg | ORAL_CAPSULE | Freq: Once | ORAL | Status: AC
  Administered 2023-07-11: 25 mg via ORAL
  Filled 2023-07-11: qty 1

## 2023-07-11 MED ORDER — ACETAMINOPHEN 325 MG PO TABS
650.0000 mg | ORAL_TABLET | Freq: Once | ORAL | Status: AC
  Administered 2023-07-11: 650 mg via ORAL
  Filled 2023-07-11: qty 2

## 2023-07-11 NOTE — Patient Instructions (Signed)
[facility]  Discharge Instructions: Thank you for choosing [facility] to provide your oncology and hematology care.  If you have a lab appointment with the Cancer Center - please note that after April 8th, 2024, all labs will be drawn in the cancer center.  You do not have to check in or register with the main entrance as you have in the past but will complete your check-in in the cancer center.  Wear comfortable clothing and clothing appropriate for easy access to any Portacath or PICC line.   We strive to give you quality time with your provider. You may need to reschedule your appointment if you arrive late (15 or more minutes).  Arriving late affects you and other patients whose appointments are after yours.  Also, if you miss three or more appointments without notifying the office, you may be dismissed from the clinic at the provider's discretion.      For prescription refill requests, have your pharmacy contact our office and allow 72 hours for refills to be completed.    Today you received the following chemotherapy and/or immunotherapy agents Venofer. Iron Sucrose Injection What is this medication? IRON SUCROSE (EYE ern SOO krose) treats low levels of iron (iron deficiency anemia) in people with kidney disease. Iron is a mineral that plays an important role in making red blood cells, which carry oxygen from your lungs to the rest of your body. This medicine may be used for other purposes; ask your health care provider or pharmacist if you have questions. COMMON BRAND NAME(S): Venofer What should I tell my care team before I take this medication? They need to know if you have any of these conditions: Anemia not caused by low iron levels Heart disease High levels of iron in the blood Kidney disease Liver disease An unusual or allergic reaction to iron, other medications, foods, dyes, or preservatives Pregnant or trying to get  pregnant Breastfeeding How should I use this medication? This medication is for infusion into a vein. It is given in a hospital or clinic setting. Talk to your care team about the use of this medication in children. While this medication may be prescribed for children as young as 2 years for selected conditions, precautions do apply. Overdosage: If you think you have taken too much of this medicine contact a poison control center or emergency room at once. NOTE: This medicine is only for you. Do not share this medicine with others. What if I miss a dose? Keep appointments for follow-up doses. It is important not to miss your dose. Call your care team if you are unable to keep an appointment. What may interact with this medication? Do not take this medication with any of the following: Deferoxamine Dimercaprol Other iron products This medication may also interact with the following: Chloramphenicol Deferasirox This list may not describe all possible interactions. Give your health care provider a list of all the medicines, herbs, non-prescription drugs, or dietary supplements you use. Also tell them if you smoke, drink alcohol, or use illegal drugs. Some items may interact with your medicine. What should I watch for while using this medication? Visit your care team regularly. Tell your care team if your symptoms do not start to get better or if they get worse. You may need blood work done while you are taking this medication. You may need to follow a special diet. Talk to your care team. Foods that contain iron include: whole grains/cereals, dried  fruits, beans, or peas, leafy green vegetables, and organ meats (liver, kidney). What side effects may I notice from receiving this medication? Side effects that you should report to your care team as soon as possible: Allergic reactions--skin rash, itching, hives, swelling of the face, lips, tongue, or throat Low blood pressure--dizziness, feeling  faint or lightheaded, blurry vision Shortness of breath Side effects that usually do not require medical attention (report to your care team if they continue or are bothersome): Flushing Headache Joint pain Muscle pain Nausea Pain, redness, or irritation at injection site This list may not describe all possible side effects. Call your doctor for medical advice about side effects. You may report side effects to FDA at 1-800-FDA-1088. Where should I keep my medication? This medication is given in a hospital or clinic. It will not be stored at home. NOTE: This sheet is a summary. It may not cover all possible information. If you have questions about this medicine, talk to your doctor, pharmacist, or health care provider.  2024 Elsevier/Gold Standard (2023-03-11 00:00:00)       To help prevent nausea and vomiting after your treatment, we encourage you to take your nausea medication as directed.  BELOW ARE SYMPTOMS THAT SHOULD BE REPORTED IMMEDIATELY: *FEVER GREATER THAN 100.4 F (38 C) OR HIGHER *CHILLS OR SWEATING *NAUSEA AND VOMITING THAT IS NOT CONTROLLED WITH YOUR NAUSEA MEDICATION *UNUSUAL SHORTNESS OF BREATH *UNUSUAL BRUISING OR BLEEDING *URINARY PROBLEMS (pain or burning when urinating, or frequent urination) *BOWEL PROBLEMS (unusual diarrhea, constipation, pain near the anus) TENDERNESS IN MOUTH AND THROAT WITH OR WITHOUT PRESENCE OF ULCERS (sore throat, sores in mouth, or a toothache) UNUSUAL RASH, SWELLING OR PAIN  UNUSUAL VAGINAL DISCHARGE OR ITCHING   Items with * indicate a potential emergency and should be followed up as soon as possible or go to the Emergency Department if any problems should occur.  Please show the CHEMOTHERAPY ALERT CARD or IMMUNOTHERAPY ALERT CARD at check-in to the Emergency Department and triage nurse.  Should you have questions after your visit or need to cancel or reschedule your appointment, please contact [facility]  [phone]  and follow the prompts.  Office hours are 8:00 a.m. to 4:30 p.m. Monday - Friday. Please note that voicemails left after 4:00 p.m. may not be returned until the following business day.  We are closed weekends and major holidays. You have access to a nurse at all times for urgent questions. Please call the main number to the clinic [phone] and follow the prompts.  For any non-urgent questions, you may also contact your provider using MyChart. We now offer e-Visits for anyone 67 and older to request care online for non-urgent symptoms. For details visit [email or web address].   Also download the MyChart app! Go to the app store, search "MyChart", open the app, select [place], and log in with your MyChart username and password.

## 2023-07-11 NOTE — Progress Notes (Signed)
Patient presents today for Venofer 400 mg iron infusion. MAR reviewed and updated. Vital signs stable. Patient denies any side effects related to his last iron infusion.  Venofer given today per MD orders. Tolerated infusion without adverse affects. Vital signs stable. No complaints at this time. Discharged from clinic ambulatory in stable condition. Alert and oriented x 3. F/U with [facility] as scheduled.

## 2023-07-13 ENCOUNTER — Other Ambulatory Visit (INDEPENDENT_AMBULATORY_CARE_PROVIDER_SITE_OTHER): Payer: Self-pay | Admitting: Gastroenterology

## 2023-07-13 MED ORDER — PANTOPRAZOLE SODIUM 40 MG PO TBEC: 40.0000 mg | DELAYED_RELEASE_TABLET | Freq: Every day | ORAL | 1 refills | Status: DC

## 2023-07-25 ENCOUNTER — Encounter (INDEPENDENT_AMBULATORY_CARE_PROVIDER_SITE_OTHER): Payer: Self-pay | Admitting: Gastroenterology

## 2023-07-25 ENCOUNTER — Ambulatory Visit (INDEPENDENT_AMBULATORY_CARE_PROVIDER_SITE_OTHER): Payer: BC Managed Care – PPO | Admitting: Gastroenterology

## 2023-07-25 VITALS — BP 112/68 | HR 66 | Temp 98.1°F | Ht 73.0 in | Wt 243.4 lb

## 2023-07-25 DIAGNOSIS — D509 Iron deficiency anemia, unspecified: Secondary | ICD-10-CM

## 2023-07-25 DIAGNOSIS — R7989 Other specified abnormal findings of blood chemistry: Secondary | ICD-10-CM

## 2023-07-25 DIAGNOSIS — K5900 Constipation, unspecified: Secondary | ICD-10-CM

## 2023-07-25 DIAGNOSIS — K641 Second degree hemorrhoids: Secondary | ICD-10-CM

## 2023-07-25 DIAGNOSIS — K743 Primary biliary cirrhosis: Secondary | ICD-10-CM

## 2023-07-25 DIAGNOSIS — K649 Unspecified hemorrhoids: Secondary | ICD-10-CM

## 2023-07-25 DIAGNOSIS — R768 Other specified abnormal immunological findings in serum: Secondary | ICD-10-CM

## 2023-07-25 DIAGNOSIS — F1010 Alcohol abuse, uncomplicated: Secondary | ICD-10-CM

## 2023-07-25 DIAGNOSIS — K7460 Unspecified cirrhosis of liver: Secondary | ICD-10-CM | POA: Diagnosis not present

## 2023-07-25 DIAGNOSIS — K921 Melena: Secondary | ICD-10-CM | POA: Diagnosis not present

## 2023-07-25 DIAGNOSIS — K7030 Alcoholic cirrhosis of liver without ascites: Secondary | ICD-10-CM

## 2023-07-25 DIAGNOSIS — K279 Peptic ulcer, site unspecified, unspecified as acute or chronic, without hemorrhage or perforation: Secondary | ICD-10-CM

## 2023-07-25 MED ORDER — POLYETHYLENE GLYCOL 3350 17 G PO PACK
17.0000 g | PACK | Freq: Two times a day (BID) | ORAL | 0 refills | Status: AC
Start: 2023-07-25 — End: 2023-10-23

## 2023-07-25 MED ORDER — URSODIOL 500 MG PO TABS
500.0000 mg | ORAL_TABLET | Freq: Three times a day (TID) | ORAL | 5 refills | Status: DC
Start: 2023-07-25 — End: 2023-11-22
  Filled 2023-07-26: qty 90, 30d supply, fill #0
  Filled 2023-09-10: qty 90, 30d supply, fill #1
  Filled 2023-10-15: qty 90, 30d supply, fill #2

## 2023-07-25 MED ORDER — PSYLLIUM 58.6 % PO PACK
1.0000 | PACK | Freq: Two times a day (BID) | ORAL | 2 refills | Status: AC
Start: 2023-07-25 — End: 2023-10-23

## 2023-07-25 MED ORDER — HYDROCORTISONE ACETATE 25 MG RE SUPP
25.0000 mg | Freq: Two times a day (BID) | RECTAL | 0 refills | Status: AC
Start: 2023-07-25 — End: 2023-08-01
  Filled 2023-07-26: qty 12, 6d supply, fill #0

## 2023-07-25 NOTE — Patient Instructions (Addendum)
It was very nice to meet you today, as dicussed with will plan for the following :  1)Ensure adequate fluid intake: Aim for 8 glasses of water daily. Follow a high fiber diet: Include foods such as dates, prunes, pears, and kiwi. Take Miralax twice a day for the first week, then reduce to once daily thereafter. Use Metamucil twice a day. Anusol Suppositoryx 5 days  2) 1,000 to 1,500 mg of calcium and 1,000 International Units of vitamin D daily   3) Bone mineral densitometry - to be followed up with PCP  4) bisphosphonates should be considered if patient is osteoporotic.   5) labwork and ultrasound   6) hepatitis B and all other vaccination to be updated , by PCP

## 2023-07-25 NOTE — Progress Notes (Signed)
Antonio Allen , M.D. Gastroenterology & Hepatology [facility] Gastroenterology [address] [place], [place] [address] Primary Care Physician: [name], MD [address] [place] [address]  Chief Complaint:  Newly diagnosed cirrhotic, elevated liver enzymes and possible hepatic lesion, iron deficiency anemia (ferritin 13), painless hematochezia , recent labs also concerning for PBC  History of Present Illness: Antonio Allen is a 47 y.o. male with GERD , alcohol use disorder , newly diagnosed cirrhosis with recent hospitalization for severe anemia hemoglobin 4.9 status post an upper endoscopy and colonoscopy who presents for evaluation of anemia and newly diagnosed cirrhotic, elevated liver enzymes and possible hepatic lesion, iron deficiency anemia (ferritin 13) and painless hematochezia with recent labs also concerning for PBC  Patient have not had alcohol for past 2 months, not even single drink.Previously he was drinking vodka daily for many years.  He has been still noticing fresh blood upon defecating.  Has occasional straining.  Generalized itching is better with normalization of bilirubin.  Continues to complain of fatigue. The patient denies having any nausea, vomiting, fever, chills,  melena, hematemesis, abdominal distention, abdominal pain, diarrhea or weight loss.  Last EGD:06/07/2023  Patchy severe inflammation with hemorrhage characterized by adherent blood, congestion ( edema) , erythema and mucus was found in the gastric fundus and in the gastric body.  - Normal esophagus.  - Gastritis with hemorrhage.  Treated with argon plasma coagulation (APC).  - gastric ulcer , Scar in the gastric antrum.  Biopsied.  - Duodenitis.   A. GASTRIC, BIOPSY: Antral and oxyntic mucosa with reactive gastropathy. Negative for Helicobacter pylori.  Last Colonoscopy:06/09/2023  - No evidence of old or active bleeding throughout the colon and  10 cm into Ileum - Diverticulosis in the left colon.  - Non- bleeding external and internal hemorrhoids.  - No specimens collected.  Negative ANA, ASMA AFP<1.8 1.8 IgG 2400 Ceruloplasmin elevated  Hep A IgM negative hep B nonexposed hep C negative.  HIV negative Ferritin 13 Hemoglobin 7.5 MCV 82  FHx: neg for any gastrointestinal/liver disease, no malignancies Social: Daily alcohol use.  20 days ago   Past Medical History: Past Medical History:  Diagnosis Date   Alcohol dependence (HCC)    Atypical mole 05/30/2018   moderate atypia on left upper arm   Atypical mole 05/30/2018   mild atypia on mid chest   GERD (gastroesophageal reflux disease)     Past Surgical History: Past Surgical History:  Procedure Laterality Date   BIOPSY  06/07/2023   Procedure: BIOPSY;  Surgeon: Dolores Frame, MD;  [facility];  Service: Gastroenterology;;   CERVICAL FUSION     C6   COLONOSCOPY WITH PROPOFOL N/A 06/09/2023   Procedure: COLONOSCOPY WITH PROPOFOL;  Surgeon: Franky Macho, MD;  Location: [facility];  Service: Endoscopy;  Laterality: N/A;   ESOPHAGOGASTRODUODENOSCOPY (EGD) WITH PROPOFOL N/A 06/07/2023   Procedure: ESOPHAGOGASTRODUODENOSCOPY (EGD) WITH PROPOFOL;  Surgeon: Dolores Frame, MD;  Location: [facility];  Service: Gastroenterology;  Laterality: N/A;   HOT HEMOSTASIS  06/07/2023   Procedure: HOT HEMOSTASIS (ARGON PLASMA COAGULATION/BICAP);  Surgeon: Marguerita Merles, Reuel Boom, MD;  Location: [facility];  Service: Gastroenterology;;   ORIF ANKLE FRACTURE Left 08/08/2013   Procedure: OPEN REDUCTION INTERNAL FIXATION (ORIF) LEFT ANKLE FRACTURE;  Surgeon: Jacki Cones, MD;  Location: [facility];  Service: Orthopedics;  Laterality: Left;   TONSILLECTOMY      Family History:No family history on file.  Social History: Social History   Tobacco Use  Smoking Status Never   Passive exposure: Past  Smokeless Tobacco Never   Social  History   Substance and Sexual Activity  Alcohol Use Not Currently   Comment: 1 fifth of liquor daily   Social History   Substance and Sexual Activity  Drug Use No    Allergies: Allergies  Allergen Reactions   Bee Venom Swelling    Medications: Current Outpatient Medications  Medication Sig Dispense Refill   b complex vitamins capsule Take 1 capsule by mouth daily.     ferrous sulfate 324 MG TBEC Take 1 tablet (324 mg total) by mouth daily with breakfast. 30 tablet 0   hydrocortisone (ANUSOL-HC) 25 MG suppository Place 1 suppository (25 mg total) rectally 2 (two) times daily for 5 days. 12 suppository 0   hydrOXYzine (ATARAX) 25 MG tablet Take 25 mg by mouth every 6 (six) hours.     Magnesium Glycinate 120 MG CAPS Take by mouth. 2 capsules daily     pantoprazole (PROTONIX) 40 MG tablet TAKE 1 TABLET(40 MG) BY MOUTH DAILY 90 tablet 2   polyethylene glycol (MIRALAX / GLYCOLAX) 17 g packet Take 17 g by mouth 2 (two) times daily. 180 packet 0   psyllium (METAMUCIL) 58.6 % packet Take 1 packet by mouth 2 (two) times daily. 60 packet 2   tiZANidine (ZANAFLEX) 4 MG tablet Take 4 mg by mouth every 6 (six) hours.     traMADol (ULTRAM) 50 MG tablet Take 50 mg by mouth every 6 (six) hours.     ursodiol (URSO FORTE) 500 MG tablet Take 1 tablet (500 mg total) by mouth 3 (three) times daily. 90 tablet 5   No current facility-administered medications for this visit.    Review of Systems: GENERAL: negative for malaise, night sweats HEENT: No changes in hearing or vision, no nose bleeds or other nasal problems. NECK: Negative for lumps, goiter, pain and significant neck swelling RESPIRATORY: Negative for cough, wheezing CARDIOVASCULAR: Negative for chest pain, leg swelling, palpitations, orthopnea GI: SEE HPI MUSCULOSKELETAL: Negative for joint pain or swelling, back pain, and muscle pain. SKIN: Negative for lesions, rash HEMATOLOGY Negative for prolonged bleeding, bruising easily, and  swollen nodes. ENDOCRINE: Negative for cold or heat intolerance, polyuria, polydipsia and goiter. NEURO: negative for tremor, gait imbalance, syncope and seizures. The remainder of the review of systems is noncontributory.   Physical Exam: BP 112/68   Pulse 66   Temp 98.1 F (36.7 C)   Ht 6\' 1"  (1.854 m)   Wt 243 lb 6.4 oz (110.4 kg)   BMI 32.11 kg/m  GENERAL: The patient is AO x3, in no acute distress. HEENT: Head is normocephalic and atraumatic. EOMI are intact. Mouth is well hydrated and without lesions. NECK: Supple. No masses LUNGS: Clear to auscultation. No presence of rhonchi/wheezing/rales. Adequate chest expansion HEART: RRR, normal s1 and s2. ABDOMEN: Soft, nontender, no guarding, no peritoneal signs, and nondistended. BS +. No masses. EXTREMITIES: Without any cyanosis, clubbing, rash, lesions or edema. NEUROLOGIC: AOx3, no focal motor deficit. SKIN: no jaundice, no rashes   Imaging/Labs: as above     Latest Ref Rng & Units 06/21/2023   10:00 AM 06/09/2023    4:19 AM 06/08/2023    7:42 PM  CBC  WBC 3.8 - 10.8 Thousand/uL 3.5  2.9  3.6   Hemoglobin 13.2 - 17.1 g/dL 9.5  7.5  8.0   Hematocrit 38.5 - 50.0 % 30.2  24.8  27.1  Platelets 140 - 400 Thousand/uL 331  256  294    Lab Results  Component Value Date   IRON 19 (L) 06/06/2023   TIBC 515 (H) 06/06/2023   FERRITIN 13 (L) 06/06/2023    I personally reviewed and interpreted the available labs, imaging and endoscopic files.  INR 1.1 Alk phos 250 AST 47 Hemoglobin 9.5 AMA very positive 48 Hepatitis A immune Hepatitis B has been nonimmune  Impression and Plan:  Antonio Allen is a 47 y.o. male with GERD , alcohol use disorder , newly diagnosed cirrhosis with recent hospitalization for severe anemia hemoglobin 4.9 status post an upper endoscopy and colonoscopy who presents for evaluation of anemia and newly diagnosed cirrhotic, elevated liver enzymes , iron deficiency anemia (ferritin 13) and painless  hematochezia with recent labs also concerning for PBC   #Painless hematochezia #Constipation  Patient recently had upper endoscopy and colonoscopy, upper endoscopy peptic ulcer disease and patchy area of hemorrhage status post APC colonoscopy with diverticulosis and hemorrhoids. Patient is hemodynamically stable, bleeding likely lower GI from hemorrhoids  Will repeat CBC if lower than 7 or continued blood per rectum will send to ER for evaluation  Ensure adequate fluid intake: Aim for 8 glasses of water daily. Follow a high fiber diet: Include foods such as dates, prunes, pears, and kiwi. Take Miralax twice a day for the first week, then reduce to once daily thereafter. Use Metamucil twice a day. Anusol Suppositoryx 5 days  #Cirrhosis  MELD 3.0: 20 at 06/09/2023  4:19 AM MELD-Na: 16 at 06/21/2023 10:00 AM Calculated from: Serum Creatinine: 1.07 mg/dL at 01/24/8118  1:47 AM Serum Sodium: 131 mmol/L at 06/09/2023  4:19 AM Total Bilirubin: 5.5 mg/dL at 06/15/5620  3:08 AM Serum Albumin: 2.9 g/dL at 6/57/8469  6:29 AM INR(ratio): 1.2 at 06/07/2023  4:54 AM Age at listing (hypothetical): 46 years Sex: Male at 06/09/2023  4:19 AM   #Eitology : Likely ETOH , possibble PBC   Negative ANA, ASMA AFP<1.8 1.8 IgG 2400 Ceruloplasmin elevated Hep A IgM negative hep B nonexposed hep C negative.  HIV negative AMA very positive 48 Hepatitis A immune Hepatitis B has been nonimmune   # Hepatic encephalopathy None - Avoid opiates or benzodiazepines   # Ascites - Low sodium diet None on imaging   # Esophageal varices - Last EGD 05/2023, no varices. Repeat every 1-2 years   # HCC screening - Last abdominal imaging 05/2023. CT triple phase 07/2023 without  HCC - Last AFP <1.8    Recommendation   - Check CBC, MELD labs - Reduce salt intake to <2 g per day - Can take Tylenol max of 2 g per day (650 mg q8h) for pain - Avoid NSAIDs for pain - Avoid eating raw oysters/shellfish -  Absolute ETOH cessation  -Hep B vaccination  -- RUQ sono and AFP q 6 months   #Elevated AMA #Primary Biliary Cirrhosis  Patient has constellation of fatigue, generalized pruritus, pathological fracture and now lab work with consistently elevated alk phos and significantly elevated AMA this is concerning for PBC as well   Recommendation: -Liver tests every 3?6 months -TSH annually -Bone mineral densitometry every 2 years- by PCP -Upper endoscopy every  1?3 years given cirrhotic with possible PBC -Ultrasound with or without alpha fetoprotein every 6 months -Patients with elevated lipid levels and at risk for cardiovascular disease can be considered for lipid?lowering therapy.; follow up with PCP -1,000 to 1,500 mg of calcium and 1,000 International Units of  vitamin D daily in the diet and as supplements if needed. Oral alendronate (70 mg weekly) or other effective bisphosphonates should be considered if patient is osteoporotic. -If  Model for End?Stage Liver Disease score remains elevated than 15, total bilirubin greater than 6 mg/dL,Severe intractable pruritus is an exceptional indication for liver transplantation. Chronic fatigue is not an indication for transplant because this symptom is not universally reversible after liver transplantation. -Will start UDCA in a dose of 13 to 15 mg/kg/day orally  - will repeat AMA, Liver enzymes and IgM   #Iron deficiency anemia Ferritin 13 Hemoglobin 7.5 MCV 82 Patient has fatigue and is symptomatic from severe iron deficiency and also constellation of PBC  Continue taking p.o. iron and  iron infusion given severe iron deficiency  #Peptic ulcer diease upper endoscopy peptic ulcer disease and patchy area of hemorrhage status post APC  Use Protonix (pantoprazole) 40 mg PO daily Will consider repeating upper endoscopy in 12 weeks  Avoid all NSAIDs  All questions were answered.      Antonio Lawman, MD Gastroenterology and  Hepatology [facility] Gastroenterology   This chart has been completed using [facility] Dictation software, and while attempts have been made to ensure accuracy , certain words and phrases may not be transcribed as intended

## 2023-07-26 ENCOUNTER — Telehealth (INDEPENDENT_AMBULATORY_CARE_PROVIDER_SITE_OTHER): Payer: Self-pay | Admitting: Gastroenterology

## 2023-07-26 ENCOUNTER — Other Ambulatory Visit (HOSPITAL_BASED_OUTPATIENT_CLINIC_OR_DEPARTMENT_OTHER): Payer: Self-pay

## 2023-07-26 ENCOUNTER — Encounter: Payer: Self-pay | Admitting: Oncology

## 2023-07-26 LAB — COMPREHENSIVE METABOLIC PANEL
ALT: 30 [IU]/L (ref 0–44)
AST: 45 [IU]/L — ABNORMAL HIGH (ref 0–40)
Albumin: 3.8 g/dL — ABNORMAL LOW (ref 4.1–5.1)
Alkaline Phosphatase: 303 [IU]/L — ABNORMAL HIGH (ref 44–121)
BUN/Creatinine Ratio: 8 — ABNORMAL LOW (ref 9–20)
BUN: 8 mg/dL (ref 6–24)
Bilirubin Total: 2.4 mg/dL — ABNORMAL HIGH (ref 0.0–1.2)
CO2: 20 mmol/L (ref 20–29)
Calcium: 9.3 mg/dL (ref 8.7–10.2)
Chloride: 105 mmol/L (ref 96–106)
Creatinine, Ser: 1.02 mg/dL (ref 0.76–1.27)
Globulin, Total: 3.2 g/dL (ref 1.5–4.5)
Glucose: 87 mg/dL (ref 70–99)
Potassium: 4.1 mmol/L (ref 3.5–5.2)
Sodium: 139 mmol/L (ref 134–144)
Total Protein: 7 g/dL (ref 6.0–8.5)
eGFR: 92 mL/min/{1.73_m2} (ref >59)

## 2023-07-26 LAB — CBC

## 2023-07-26 LAB — PROTIME-INR
INR: 1 (ref 0.9–1.2)
Prothrombin Time: 11.6 s (ref 9.1–12.0)

## 2023-07-26 LAB — AFP TUMOR MARKER: AFP, Serum, Tumor Marker: <1.8 ng/mL (ref 0.0–6.9)

## 2023-07-26 LAB — MITOCHONDRIAL ANTIBODIES: Mitochondrial Ab: 219.8 U — ABNORMAL HIGH (ref 0.0–20.0)

## 2023-07-26 LAB — IGM: IgM (Immunoglobulin M), Srm: 376 mg/dL — ABNORMAL HIGH (ref 20–172)

## 2023-07-26 NOTE — Telephone Encounter (Signed)
Pt needing ultrasound in 4 months.

## 2023-07-27 ENCOUNTER — Encounter: Payer: Self-pay | Admitting: Oncology

## 2023-07-27 ENCOUNTER — Inpatient Hospital Stay: Payer: BC Managed Care – PPO

## 2023-07-27 NOTE — Progress Notes (Signed)
I spoke to the patient wife the lab work shows evidence of PBC (Primary Biliary Cirrhosis)   Start Ursodiol as ordered and discussed during clinic visit .  Blood count ( CBC) was cancelled , there is another order by Hematology for CBC and Iron studies   If patient continues to have hematochezia or inadequate response to ferritin supplementation , patient will call us and we will plan for capsule endoscopy at that time  Antonio Lawman, MD Gastroenterology and Hepatology [facility] Gastroenterology

## 2023-07-28 ENCOUNTER — Inpatient Hospital Stay: Payer: BC Managed Care – PPO

## 2023-07-28 ENCOUNTER — Other Ambulatory Visit: Payer: BC Managed Care – PPO

## 2023-07-28 ENCOUNTER — Other Ambulatory Visit (HOSPITAL_BASED_OUTPATIENT_CLINIC_OR_DEPARTMENT_OTHER): Payer: Self-pay

## 2023-07-28 ENCOUNTER — Encounter: Payer: Self-pay | Admitting: Oncology

## 2023-07-28 ENCOUNTER — Other Ambulatory Visit (HOSPITAL_COMMUNITY): Payer: Self-pay | Admitting: Family Medicine

## 2023-07-28 DIAGNOSIS — K7030 Alcoholic cirrhosis of liver without ascites: Secondary | ICD-10-CM

## 2023-07-28 MED ORDER — TRAMADOL HCL 50 MG PO TABS
50.0000 mg | ORAL_TABLET | Freq: Four times a day (QID) | ORAL | 0 refills | Status: DC
  Filled 2023-07-28: qty 60, 15d supply, fill #0

## 2023-07-29 ENCOUNTER — Other Ambulatory Visit: Payer: Self-pay

## 2023-07-29 ENCOUNTER — Inpatient Hospital Stay: Payer: BC Managed Care – PPO | Attending: Oncology

## 2023-07-29 DIAGNOSIS — Z91030 Bee allergy status: Secondary | ICD-10-CM | POA: Insufficient documentation

## 2023-07-29 DIAGNOSIS — K5900 Constipation, unspecified: Secondary | ICD-10-CM | POA: Diagnosis not present

## 2023-07-29 DIAGNOSIS — Z79899 Other long term (current) drug therapy: Secondary | ICD-10-CM | POA: Diagnosis not present

## 2023-07-29 DIAGNOSIS — K279 Peptic ulcer, site unspecified, unspecified as acute or chronic, without hemorrhage or perforation: Secondary | ICD-10-CM | POA: Insufficient documentation

## 2023-07-29 DIAGNOSIS — D509 Iron deficiency anemia, unspecified: Secondary | ICD-10-CM | POA: Insufficient documentation

## 2023-07-29 DIAGNOSIS — D72819 Decreased white blood cell count, unspecified: Secondary | ICD-10-CM | POA: Insufficient documentation

## 2023-07-29 DIAGNOSIS — K743 Primary biliary cirrhosis: Secondary | ICD-10-CM | POA: Insufficient documentation

## 2023-07-29 DIAGNOSIS — D500 Iron deficiency anemia secondary to blood loss (chronic): Secondary | ICD-10-CM

## 2023-07-29 DIAGNOSIS — K7030 Alcoholic cirrhosis of liver without ascites: Secondary | ICD-10-CM | POA: Diagnosis not present

## 2023-07-29 DIAGNOSIS — R5383 Other fatigue: Secondary | ICD-10-CM | POA: Diagnosis not present

## 2023-07-29 LAB — TSH: TSH: 1.979 u[IU]/mL (ref 0.350–4.500)

## 2023-07-29 LAB — CBC WITH DIFFERENTIAL/PLATELET
Abs Immature Granulocytes: 0.01 10*3/uL (ref 0.00–0.07)
Basophils Absolute: 0.1 10*3/uL (ref 0.0–0.1)
Basophils Relative: 3 %
Eosinophils Absolute: 0.1 10*3/uL (ref 0.0–0.5)
Eosinophils Relative: 7 %
HCT: 35.5 % — ABNORMAL LOW (ref 39.0–52.0)
Hemoglobin: 11.3 g/dL — ABNORMAL LOW (ref 13.0–17.0)
Immature Granulocytes: 1 %
Lymphocytes Relative: 32 %
Lymphs Abs: 0.6 10*3/uL — ABNORMAL LOW (ref 0.7–4.0)
MCH: 27.1 pg (ref 26.0–34.0)
MCHC: 31.8 g/dL (ref 30.0–36.0)
MCV: 85.1 fL (ref 80.0–100.0)
Monocytes Absolute: 0.2 10*3/uL (ref 0.1–1.0)
Monocytes Relative: 10 %
Neutro Abs: 0.9 10*3/uL — ABNORMAL LOW (ref 1.7–7.7)
Neutrophils Relative %: 47 %
Platelets: 156 10*3/uL (ref 150–400)
RBC: 4.17 MIL/uL — ABNORMAL LOW (ref 4.22–5.81)
RDW: 21.2 % — ABNORMAL HIGH (ref 11.5–15.5)
WBC: 1.8 10*3/uL — ABNORMAL LOW (ref 4.0–10.5)
nRBC: 0 % (ref 0.0–0.2)

## 2023-07-29 LAB — LIPID PANEL
Cholesterol: 148 mg/dL (ref 0–200)
HDL: 29 mg/dL — ABNORMAL LOW (ref >40)
LDL Cholesterol: 103 mg/dL — ABNORMAL HIGH (ref 0–99)
Total CHOL/HDL Ratio: 5.1 {ratio}
Triglycerides: 79 mg/dL (ref <150)
VLDL: 16 mg/dL (ref 0–40)

## 2023-07-29 LAB — FERRITIN: Ferritin: 90 ng/mL (ref 24–336)

## 2023-07-29 LAB — IRON AND TIBC
Iron: 90 ug/dL (ref 45–182)
Saturation Ratios: 28 % (ref 17.9–39.5)
TIBC: 320 ug/dL (ref 250–450)
UIBC: 230 ug/dL

## 2023-07-29 LAB — VITAMIN D 25 HYDROXY (VIT D DEFICIENCY, FRACTURES): Vit D, 25-Hydroxy: 64.17 ng/mL (ref 30–100)

## 2023-07-29 LAB — PSA: Prostatic Specific Antigen: 0.56 ng/mL (ref 0.00–4.00)

## 2023-08-04 ENCOUNTER — Inpatient Hospital Stay: Payer: BC Managed Care – PPO | Admitting: Oncology

## 2023-08-04 ENCOUNTER — Inpatient Hospital Stay: Payer: BC Managed Care – PPO

## 2023-08-04 ENCOUNTER — Encounter: Payer: Self-pay | Admitting: Oncology

## 2023-08-04 ENCOUNTER — Ambulatory Visit: Payer: BC Managed Care – PPO | Admitting: Oncology

## 2023-08-04 ENCOUNTER — Other Ambulatory Visit: Payer: Self-pay

## 2023-08-04 VITALS — BP 116/67 | HR 60 | Temp 97.9°F | Resp 16 | Ht 73.0 in | Wt 241.2 lb

## 2023-08-04 DIAGNOSIS — K7030 Alcoholic cirrhosis of liver without ascites: Secondary | ICD-10-CM | POA: Diagnosis not present

## 2023-08-04 DIAGNOSIS — D500 Iron deficiency anemia secondary to blood loss (chronic): Secondary | ICD-10-CM

## 2023-08-04 DIAGNOSIS — K743 Primary biliary cirrhosis: Secondary | ICD-10-CM | POA: Insufficient documentation

## 2023-08-04 DIAGNOSIS — D72819 Decreased white blood cell count, unspecified: Secondary | ICD-10-CM | POA: Insufficient documentation

## 2023-08-04 DIAGNOSIS — D509 Iron deficiency anemia, unspecified: Secondary | ICD-10-CM | POA: Diagnosis not present

## 2023-08-04 DIAGNOSIS — D709 Neutropenia, unspecified: Secondary | ICD-10-CM

## 2023-08-04 DIAGNOSIS — K279 Peptic ulcer, site unspecified, unspecified as acute or chronic, without hemorrhage or perforation: Secondary | ICD-10-CM

## 2023-08-04 LAB — CBC WITH DIFFERENTIAL/PLATELET
Abs Immature Granulocytes: 0 10*3/uL (ref 0.00–0.07)
Basophils Absolute: 0.1 10*3/uL (ref 0.0–0.1)
Basophils Relative: 3 %
Eosinophils Absolute: 0.2 10*3/uL (ref 0.0–0.5)
Eosinophils Relative: 7 %
HCT: 34.9 % — ABNORMAL LOW (ref 39.0–52.0)
Hemoglobin: 11 g/dL — ABNORMAL LOW (ref 13.0–17.0)
Immature Granulocytes: 0 %
Lymphocytes Relative: 38 %
Lymphs Abs: 0.9 10*3/uL (ref 0.7–4.0)
MCH: 27.2 pg (ref 26.0–34.0)
MCHC: 31.5 g/dL (ref 30.0–36.0)
MCV: 86.2 fL (ref 80.0–100.0)
Monocytes Absolute: 0.3 10*3/uL (ref 0.1–1.0)
Monocytes Relative: 13 %
Neutro Abs: 1 10*3/uL — ABNORMAL LOW (ref 1.7–7.7)
Neutrophils Relative %: 39 %
Platelets: 181 10*3/uL (ref 150–400)
RBC: 4.05 MIL/uL — ABNORMAL LOW (ref 4.22–5.81)
RDW: 20.4 % — ABNORMAL HIGH (ref 11.5–15.5)
WBC: 2.4 10*3/uL — ABNORMAL LOW (ref 4.0–10.5)
nRBC: 0 % (ref 0.0–0.2)

## 2023-08-04 NOTE — Assessment & Plan Note (Addendum)
White blood cell count is low at 1.8 with ANC of 900, likely due to liver cirrhosis. No fever or signs of infection reported. -Repeat labs today and in two weeks. If still low, consider bone marrow biopsy. -Advised patient to go to the ER if fever exceeds 100.39F.

## 2023-08-04 NOTE — Progress Notes (Addendum)
[facility]  HEMATOLOGY FOLLOW-UP VISIT  [name], MD  REASON FOR FOLLOW-UP: Iron deficiency anemia  ASSESSMENT & PLAN:  Patient is a 47 year old male with past medical history of alcohol use and recent diagnosis of cirrhosis referred for iron deficiency anemia   Iron deficiency anemia due to chronic blood loss Improvement in iron labs and hemoglobin after two doses of IV iron and continued oral iron supplementation. Some constipation reported. -Continue oral iron supplementation every other day. -Consider use of Miralax for constipation as needed. -Recheck labs in one month. If not improving appropriately, consider additional dose of IV iron.  Peptic ulcer disease No current symptoms reported with pantoprazole. -Continue pantoprazole as prescribed.   Cirrhosis, alcoholic (HCC) No alcohol consumption reported since last visit. No withdrawal symptoms. -Continue alcohol abstinence.  Leukopenia White blood cell count is low at 1.8 with ANC of 900, likely due to liver cirrhosis. No fever or signs of infection reported. -Repeat labs today and in two weeks. If still low, consider bone marrow biopsy. -Advised patient to go to the ER if fever exceeds 100.10F.  Primary biliary cirrhosis (HCC) Recently diagnosed. Associated with high risk of hepatocellular and cholangiocarcinoma -Continue regular follow-ups with Dr. Tasia Catchings and undergo recommended endoscopies.   Orders Placed This Encounter  Procedures   CBC with Differential/Platelet    Standing Status:   Future    Number of Occurrences:   1    Standing Expiration Date:   08/03/2024   CBC with Differential/Platelet    Standing Status:   Future    Standing Expiration Date:   08/03/2024   Ferritin    Standing Status:   Future    Standing Expiration Date:   08/03/2024   Iron and TIBC ([facility] ONLY)    Standing Status:   Future    Standing Expiration Date:    08/03/2024   Comprehensive metabolic panel    Standing Status:   Future    Standing Expiration Date:   08/03/2024    The total time spent in the appointment was 20 minutes encounter with patients including review of chart and various tests results, discussions about plan of care and coordination of care plan   All questions were answered. The patient knows to call the clinic with any problems, questions or concerns. No barriers to learning was detected.  Cindie Crumbly, MD 10/17/20249:52 AM   SUMMARY OF HEMATOLOGIC HISTORY:    Latest Ref Rng & Units 08/04/2023    9:39 AM 07/29/2023    9:15 AM 07/25/2023   10:43 AM  CBC  WBC 4.0 - 10.5 K/uL 2.4  1.8  CANCELED   Hemoglobin 13.0 - 17.0 g/dL 09.3  23.5  CANCELED   Hematocrit 39.0 - 52.0 % 34.9  35.5  CANCELED   Platelets 150 - 400 K/uL 181  156  CANCELED    Lab Results  Component Value Date   IRON 90 07/29/2023   TIBC 320 07/29/2023   FERRITIN 90 07/29/2023     INTERVAL HISTORY: ABHI MOCCIA is 48 y.o. male is here for follow up for iron deficiency anemia after 2 doses of IV Iron. The patient reports an improvement in his energy levels over the past ten days, although he still experiences fatigue in the evenings. He continues to take oral iron supplements every other day. The patient has experienced some constipation, which he manages with Miralax as needed.  The patient also reports bloody stools, which have been occurring  less frequently in the past week. The blood is bright red, and the patient experiences some pain with bowel movements. He has been prescribed suppositories for an internal hemorrhoid, but has not yet needed to use them. The patient experiences slight dizziness when getting up quickly, but is unsure if this is related to his medical conditions. He reports no shortness of breath or night sweats. His gastric reflux has improved with medication, and he has not consumed alcohol since his last visit.  I have  reviewed the past medical history, past surgical history, social history and family history with the patient   ALLERGIES:  is allergic to bee venom.  MEDICATIONS:  Current Outpatient Medications  Medication Sig Dispense Refill   b complex vitamins capsule Take 1 capsule by mouth daily.     ferrous sulfate 324 MG TBEC Take 1 tablet (324 mg total) by mouth daily with breakfast. 30 tablet 0   hydrOXYzine (ATARAX) 25 MG tablet Take 25 mg by mouth every 6 (six) hours.     Magnesium Glycinate 120 MG CAPS Take by mouth. 2 capsules daily     pantoprazole (PROTONIX) 40 MG tablet TAKE 1 TABLET(40 MG) BY MOUTH DAILY 90 tablet 2   polyethylene glycol (MIRALAX / GLYCOLAX) 17 g packet Take 17 g by mouth 2 (two) times daily. 180 packet 0   psyllium (METAMUCIL) 58.6 % packet Take 1 packet by mouth 2 (two) times daily. 60 packet 2   tiZANidine (ZANAFLEX) 4 MG tablet Take 4 mg by mouth every 6 (six) hours.     traMADol (ULTRAM) 50 MG tablet Take 50 mg by mouth every 6 (six) hours.     traMADol (ULTRAM) 50 MG tablet Take 1 tablet (50 mg total) by mouth every 6 (six) hours. 60 tablet 0   ursodiol (URSO FORTE) 500 MG tablet Take 1 tablet (500 mg total) by mouth 3 (three) times daily. 90 tablet 5   No current facility-administered medications for this visit.     REVIEW OF SYSTEMS:   Constitutional: Denies fevers, chills or night sweats Eyes: Denies blurriness of vision Ears, nose, mouth, throat, and face: Denies mucositis or sore throat Respiratory: Denies cough, dyspnea or wheezes Cardiovascular: Denies palpitation, chest discomfort or lower extremity swelling Gastrointestinal:  Denies nausea, heartburn or change in bowel habits Skin: Denies abnormal skin rashes Lymphatics: Denies new lymphadenopathy or easy bruising Neurological:Denies numbness, tingling or new weaknesses Behavioral/Psych: Mood is stable, no new changes  All other systems were reviewed with the patient and are negative.  PHYSICAL  EXAMINATION:   Vitals:   08/04/23 0908  BP: 116/67  Pulse: 60  Resp: 16  Temp: 97.9 F (36.6 C)  SpO2: 100%    GENERAL:alert, no distress and comfortable LUNGS: clear to auscultation and percussion with normal breathing effort HEART: regular rate & rhythm and no murmurs and no lower extremity edema ABDOMEN:abdomen soft, non-tender and normal bowel sounds Musculoskeletal:no cyanosis of digits and no clubbing  NEURO: alert & oriented x 3 with fluent speech  LABORATORY DATA:  I have reviewed the data as listed  Lab Results  Component Value Date   WBC 2.4 (L) 08/04/2023   NEUTROABS 1.0 (L) 08/04/2023   HGB 11.0 (L) 08/04/2023   HCT 34.9 (L) 08/04/2023   MCV 86.2 08/04/2023   PLT 181 08/04/2023      Component Value Date/Time   NA 139 07/25/2023 1043   K 4.1 07/25/2023 1043   CL 105 07/25/2023 1043   CO2 20 07/25/2023  1043   GLUCOSE 87 07/25/2023 1043   GLUCOSE 112 (H) 06/21/2023 1000   BUN 8 07/25/2023 1043   CREATININE 1.02 07/25/2023 1043   CREATININE 0.90 06/21/2023 1000   CALCIUM 9.3 07/25/2023 1043   PROT 7.0 07/25/2023 1043   ALBUMIN 3.8 (L) 07/25/2023 1043   AST 45 (H) 07/25/2023 1043   ALT 30 07/25/2023 1043   ALKPHOS 303 (H) 07/25/2023 1043   BILITOT 2.4 (H) 07/25/2023 1043   GFRNONAA >60 06/09/2023 0419   GFRAA >90 08/08/2013 1000       Chemistry      Component Value Date/Time   NA 139 07/25/2023 1043   K 4.1 07/25/2023 1043   CL 105 07/25/2023 1043   CO2 20 07/25/2023 1043   BUN 8 07/25/2023 1043   CREATININE 1.02 07/25/2023 1043   CREATININE 0.90 06/21/2023 1000      Component Value Date/Time   CALCIUM 9.3 07/25/2023 1043   ALKPHOS 303 (H) 07/25/2023 1043   AST 45 (H) 07/25/2023 1043   ALT 30 07/25/2023 1043   BILITOT 2.4 (H) 07/25/2023 1043     Lab Results  Component Value Date   IRON 90 07/29/2023   TIBC 320 07/29/2023   FERRITIN 90 07/29/2023

## 2023-08-04 NOTE — Patient Instructions (Signed)
VISIT SUMMARY:  During your recent visit, we discussed your ongoing health conditions, including iron deficiency anemia, primary biliary cirrhosis, leukopenia, gastroesophageal reflux disease, and hematochezia. You reported an improvement in your energy levels and a decrease in the frequency of bloody stools. You also mentioned that you have been abstaining from alcohol, which is excellent for your health.  YOUR PLAN:  -IRON DEFICIENCY ANEMIA: This is a condition where your body lacks enough iron to produce hemoglobin, the part of your red blood cells that carries oxygen. You should continue taking oral iron supplements every other day and use Miralax for constipation as needed. We will recheck your labs in one month, and if there's no improvement, we may consider an additional dose of IV iron.  -PRIMARY BILIARY CIRRHOSIS: This is a chronic disease that slowly destroys the bile ducts in your liver, leading to scarring and cirrhosis. You should continue regular follow-ups with Dr. Tasia Catchings and undergo recommended endoscopies to monitor your condition.  -LEUKOPENIA: This is a decrease in the number of white blood cells in your blood, which can make you more susceptible to infections. We will repeat your labs today and in two weeks. If your white blood cell count remains low, we may consider a bone marrow biopsy. If you develop a fever above 100.84F, please go to the emergency room immediately.  -GASTROESOPHAGEAL REFLUX DISEASE: This is a condition where stomach acid frequently flows back into the tube connecting your mouth and stomach (esophagus). You should continue taking pantoprazole as prescribed to manage your symptoms.  -HEMATOCHEZIA: This is the presence of bright red blood in your stools, likely due to an internal hemorrhoid. Continue using suppositories as needed. We will repeat your labs today and before your next visit to monitor for anemia.  -ALCOHOL ABSTINENCE: You have been abstaining from  alcohol, which is beneficial for your liver health. Please continue to avoid alcohol.  INSTRUCTIONS:  Please follow up in one month to recheck your labs. If your labs are stable, your next visit will be in three months. Remember to take your medications as prescribed and continue to abstain from alcohol. If you experience any new or worsening symptoms, please contact our office immediately.

## 2023-08-04 NOTE — Addendum Note (Signed)
Addended byCindie Crumbly on: 08/04/2023 09:53 AM   Modules accepted: Orders

## 2023-08-04 NOTE — Assessment & Plan Note (Signed)
No alcohol consumption reported since last visit. No withdrawal symptoms. -Continue alcohol abstinence.

## 2023-08-04 NOTE — Assessment & Plan Note (Signed)
Improvement in iron labs and hemoglobin after two doses of IV iron and continued oral iron supplementation. Some constipation reported. -Continue oral iron supplementation every other day. -Consider use of Miralax for constipation as needed. -Recheck labs in one month. If not improving appropriately, consider additional dose of IV iron.

## 2023-08-04 NOTE — Assessment & Plan Note (Signed)
No current symptoms reported with pantoprazole. -Continue pantoprazole as prescribed.

## 2023-08-04 NOTE — Assessment & Plan Note (Signed)
Recently diagnosed. Associated with high risk of hepatocellular and cholangiocarcinoma -Continue regular follow-ups with Dr. Tasia Catchings and undergo recommended endoscopies.

## 2023-08-10 ENCOUNTER — Encounter (INDEPENDENT_AMBULATORY_CARE_PROVIDER_SITE_OTHER): Payer: Self-pay

## 2023-08-10 NOTE — Telephone Encounter (Signed)
Pt was seen in office 07/25/23. Went ahead and scheduled ultrasound and will send appt reminder to pt via mail

## 2023-08-16 ENCOUNTER — Other Ambulatory Visit (HOSPITAL_BASED_OUTPATIENT_CLINIC_OR_DEPARTMENT_OTHER): Payer: Self-pay

## 2023-08-17 ENCOUNTER — Other Ambulatory Visit (HOSPITAL_BASED_OUTPATIENT_CLINIC_OR_DEPARTMENT_OTHER): Payer: Self-pay

## 2023-08-17 MED FILL — Pantoprazole Sodium EC Tab 40 MG (Base Equiv): ORAL | 30 days supply | Qty: 30 | Fill #0 | Status: AC

## 2023-08-25 ENCOUNTER — Inpatient Hospital Stay: Payer: BC Managed Care – PPO | Attending: Oncology

## 2023-08-25 DIAGNOSIS — Z91030 Bee allergy status: Secondary | ICD-10-CM | POA: Insufficient documentation

## 2023-08-25 DIAGNOSIS — D509 Iron deficiency anemia, unspecified: Secondary | ICD-10-CM | POA: Diagnosis present

## 2023-08-25 DIAGNOSIS — K279 Peptic ulcer, site unspecified, unspecified as acute or chronic, without hemorrhage or perforation: Secondary | ICD-10-CM | POA: Diagnosis not present

## 2023-08-25 DIAGNOSIS — Z79899 Other long term (current) drug therapy: Secondary | ICD-10-CM | POA: Diagnosis not present

## 2023-08-25 DIAGNOSIS — K7030 Alcoholic cirrhosis of liver without ascites: Secondary | ICD-10-CM | POA: Insufficient documentation

## 2023-08-25 DIAGNOSIS — R748 Abnormal levels of other serum enzymes: Secondary | ICD-10-CM | POA: Diagnosis not present

## 2023-08-25 DIAGNOSIS — R5383 Other fatigue: Secondary | ICD-10-CM | POA: Diagnosis not present

## 2023-08-25 DIAGNOSIS — D500 Iron deficiency anemia secondary to blood loss (chronic): Secondary | ICD-10-CM | POA: Insufficient documentation

## 2023-08-25 DIAGNOSIS — K921 Melena: Secondary | ICD-10-CM | POA: Insufficient documentation

## 2023-08-25 DIAGNOSIS — M549 Dorsalgia, unspecified: Secondary | ICD-10-CM | POA: Insufficient documentation

## 2023-08-25 DIAGNOSIS — K743 Primary biliary cirrhosis: Secondary | ICD-10-CM | POA: Diagnosis not present

## 2023-08-25 DIAGNOSIS — D72819 Decreased white blood cell count, unspecified: Secondary | ICD-10-CM | POA: Insufficient documentation

## 2023-08-25 LAB — IRON AND TIBC
Iron: 98 ug/dL (ref 45–182)
Saturation Ratios: 32 % (ref 17.9–39.5)
TIBC: 311 ug/dL (ref 250–450)
UIBC: 213 ug/dL

## 2023-08-25 LAB — CBC WITH DIFFERENTIAL/PLATELET
Abs Immature Granulocytes: 0.01 10*3/uL (ref 0.00–0.07)
Basophils Absolute: 0 10*3/uL (ref 0.0–0.1)
Basophils Relative: 2 %
Eosinophils Absolute: 0.3 10*3/uL (ref 0.0–0.5)
Eosinophils Relative: 11 %
HCT: 32.6 % — ABNORMAL LOW (ref 39.0–52.0)
Hemoglobin: 10.6 g/dL — ABNORMAL LOW (ref 13.0–17.0)
Immature Granulocytes: 0 %
Lymphocytes Relative: 32 %
Lymphs Abs: 0.7 10*3/uL (ref 0.7–4.0)
MCH: 28.6 pg (ref 26.0–34.0)
MCHC: 32.5 g/dL (ref 30.0–36.0)
MCV: 87.9 fL (ref 80.0–100.0)
Monocytes Absolute: 0.2 10*3/uL (ref 0.1–1.0)
Monocytes Relative: 9 %
Neutro Abs: 1.1 10*3/uL — ABNORMAL LOW (ref 1.7–7.7)
Neutrophils Relative %: 46 %
Platelets: 190 10*3/uL (ref 150–400)
RBC: 3.71 MIL/uL — ABNORMAL LOW (ref 4.22–5.81)
RDW: 18.3 % — ABNORMAL HIGH (ref 11.5–15.5)
WBC: 2.3 10*3/uL — ABNORMAL LOW (ref 4.0–10.5)
nRBC: 0 % (ref 0.0–0.2)

## 2023-08-25 LAB — FERRITIN: Ferritin: 90 ng/mL (ref 24–336)

## 2023-08-25 LAB — COMPREHENSIVE METABOLIC PANEL
ALT: 84 U/L — ABNORMAL HIGH (ref 0–44)
AST: 114 U/L — ABNORMAL HIGH (ref 15–41)
Albumin: 3 g/dL — ABNORMAL LOW (ref 3.5–5.0)
Alkaline Phosphatase: 171 U/L — ABNORMAL HIGH (ref 38–126)
Anion gap: 10 (ref 5–15)
BUN: 7 mg/dL (ref 6–20)
CO2: 24 mmol/L (ref 22–32)
Calcium: 9.1 mg/dL (ref 8.9–10.3)
Chloride: 101 mmol/L (ref 98–111)
Creatinine, Ser: 0.95 mg/dL (ref 0.61–1.24)
GFR, Estimated: >60 mL/min (ref >60)
Glucose, Bld: 104 mg/dL — ABNORMAL HIGH (ref 70–99)
Potassium: 3.6 mmol/L (ref 3.5–5.1)
Sodium: 135 mmol/L (ref 135–145)
Total Bilirubin: 6.5 mg/dL — ABNORMAL HIGH (ref <1.2)
Total Protein: 7.5 g/dL (ref 6.5–8.1)

## 2023-08-29 ENCOUNTER — Encounter (INDEPENDENT_AMBULATORY_CARE_PROVIDER_SITE_OTHER): Payer: Self-pay | Admitting: Gastroenterology

## 2023-08-30 ENCOUNTER — Other Ambulatory Visit (HOSPITAL_BASED_OUTPATIENT_CLINIC_OR_DEPARTMENT_OTHER): Payer: Self-pay

## 2023-08-30 MED ORDER — ONDANSETRON HCL 4 MG PO TABS
4.0000 mg | ORAL_TABLET | Freq: Three times a day (TID) | ORAL | 1 refills | Status: AC | PRN
  Filled 2023-08-30: qty 18, 21d supply, fill #0

## 2023-09-01 ENCOUNTER — Encounter: Payer: Self-pay | Admitting: Oncology

## 2023-09-01 ENCOUNTER — Inpatient Hospital Stay: Payer: BC Managed Care – PPO | Admitting: Oncology

## 2023-09-01 VITALS — BP 99/76 | HR 64 | Temp 98.6°F | Resp 16 | Wt 235.0 lb

## 2023-09-01 DIAGNOSIS — K7030 Alcoholic cirrhosis of liver without ascites: Secondary | ICD-10-CM

## 2023-09-01 DIAGNOSIS — R8290 Unspecified abnormal findings in urine: Secondary | ICD-10-CM

## 2023-09-01 DIAGNOSIS — D708 Other neutropenia: Secondary | ICD-10-CM

## 2023-09-01 DIAGNOSIS — K743 Primary biliary cirrhosis: Secondary | ICD-10-CM

## 2023-09-01 DIAGNOSIS — K279 Peptic ulcer, site unspecified, unspecified as acute or chronic, without hemorrhage or perforation: Secondary | ICD-10-CM

## 2023-09-01 DIAGNOSIS — D500 Iron deficiency anemia secondary to blood loss (chronic): Secondary | ICD-10-CM

## 2023-09-01 LAB — URINALYSIS, ROUTINE W REFLEX MICROSCOPIC
Glucose, UA: NEGATIVE mg/dL
Hgb urine dipstick: NEGATIVE
Ketones, ur: NEGATIVE mg/dL
Leukocytes,Ua: NEGATIVE
Nitrite: NEGATIVE
Protein, ur: NEGATIVE mg/dL
Specific Gravity, Urine: 1.011 (ref 1.005–1.030)
pH: 7 (ref 5.0–8.0)

## 2023-09-01 NOTE — Assessment & Plan Note (Signed)
Leukopenia likely secondary to cirrhosis -Improved from prior -Continue to monitor -Advised patient to monitor for fevers and go to the ER if it is more than 100.11F

## 2023-09-01 NOTE — Assessment & Plan Note (Signed)
No alcohol consumption reported since last visit. No withdrawal symptoms. -Continue alcohol abstinence.

## 2023-09-01 NOTE — Assessment & Plan Note (Addendum)
Elevated liver enzymes and bilirubin on recent labs.  Ultrasound of with evidence of liver cirrhosis and splenomegaly -Discussed with Dr. Tasia Catchings to see if patient needs to be seen sooner than his scheduled appointment in January

## 2023-09-01 NOTE — Assessment & Plan Note (Signed)
Patient reported some cloudy urine with pain in the right flank -Will obtain urinalysis

## 2023-09-01 NOTE — Assessment & Plan Note (Signed)
No current symptoms reported with pantoprazole. -Continue pantoprazole as prescribed.

## 2023-09-01 NOTE — Assessment & Plan Note (Addendum)
Ongoing bright red and dark blood in stools, with recent episode of significant bleeding. Iron labs within normal limits but ferritin less than 100. Anemia and fatigue likely multifactorial due to blood loss, inadequate iron intake, and chronic disease. -Will schedule for 2 more doses of IV iron and recheck labs in 3 months.  Will aim for a higher goal of ferritin considering active bleeding and symptomatic with fatigue -Continue oral iron supplementation every other day -Encouraged protein and green leafy vegetable intake

## 2023-09-01 NOTE — Progress Notes (Signed)
[facility] at [facility] HEMATOLOGY FOLLOW-UP VISIT  Antonio Found, MD  REASON FOR FOLLOW-UP: Iron deficiency anemia  ASSESSMENT & PLAN:  Patient is a 47 year old male with past medical history of alcohol use, primary biliary cirrhosis and peptic ulcer disease following for iron deficiency anemia   Iron deficiency anemia due to chronic blood loss Ongoing bright red and dark blood in stools, with recent episode of significant bleeding. Iron labs within normal limits but ferritin less than 100. Anemia and fatigue likely multifactorial due to blood loss, inadequate iron intake, and chronic disease. -Will schedule for 2 more doses of IV iron and recheck labs in 3 months.  Will aim for a higher goal of ferritin considering active bleeding and symptomatic with fatigue -Continue oral iron supplementation every other day -Encouraged protein and green leafy vegetable intake  Primary biliary cirrhosis (HCC) Elevated liver enzymes and bilirubin on recent labs.  Ultrasound of with evidence of liver cirrhosis and splenomegaly -Discussed with Dr. Tasia Catchings to see if patient needs to be seen sooner than his scheduled appointment in January  Cloudy urine Patient reported some cloudy urine with pain in the right flank -Will obtain urinalysis  Peptic ulcer disease No current symptoms reported with pantoprazole. -Continue pantoprazole as prescribed.   Cirrhosis, alcoholic (HCC) No alcohol consumption reported since last visit. No withdrawal symptoms. -Continue alcohol abstinence.  Leukopenia Leukopenia likely secondary to cirrhosis -Improved from prior -Continue to monitor -Advised patient to monitor for fevers and go to the ER if it is more than 100.44F   Orders Placed This Encounter  Procedures   Urinalysis, Routine w reflex microscopic    Standing Status:   Future    Number of Occurrences:   1    Standing Expiration Date:   08/31/2024   CBC with  Differential/Platelet    Standing Status:   Future    Standing Expiration Date:   08/31/2024   Comprehensive metabolic panel    Standing Status:   Future    Standing Expiration Date:   08/31/2024   Ferritin    Standing Status:   Future    Standing Expiration Date:   08/31/2024   Folate    Standing Status:   Future    Standing Expiration Date:   08/31/2024   Vitamin B12    Standing Status:   Future    Standing Expiration Date:   08/31/2024   Iron and TIBC    Standing Status:   Future    Standing Expiration Date:   08/31/2024    The total time spent in the appointment was 20 minutes encounter with patients including review of chart and various tests results, discussions about plan of care and coordination of care plan   All questions were answered. The patient knows to call the clinic with any problems, questions or concerns. No barriers to learning was detected.  Cindie Crumbly, MD 11/14/202410:41 AM   SUMMARY OF HEMATOLOGIC HISTORY: Patient has anemia secondary to iron deficiency, chronic blood loss due to PUD and a competent of chronic disease secondary to cirrhosis -S/p IV Venofer 400 mg X 2 doses   INTERVAL HISTORY: Antonio Allen 47 y.o. male with past medical history of alcohol use, primary biliary cirrhosis, PUD iron deficiency following up for iron deficiency anemia he reports ongoing issues of nausea, fatigue and hematochezia. The patient reports that his energy levels fluctuate, with periods of feeling decent for two or three days followed by sudden crashes of low energy. The  patient also reports ongoing nausea, which has been severe enough to affect his appetite and eating habits.  The patient has been experiencing hematochezia, with both bright red blood and dark liquid color noted in his stools. The patient reports that the stool itself is brown, but there is a presence of what appears to be dark blood. The patient has a follow-up appointment with a gastroenterologist  scheduled for January.  The patient also reports some pain in his back, described as a constant ache with occasional sharpness. The pain seems to radiate around the back area. The patient has a history of primary biliary cirrhosis and anemia. He has stopped taking iron pills due to constipation, and he reports that his bowel movements were normal without blood until a recent episode. The patient has not been drinking alcohol.   I have reviewed the past medical history, past surgical history, social history and family history with the patient   ALLERGIES:  is allergic to bee venom.  MEDICATIONS:  Current Outpatient Medications  Medication Sig Dispense Refill   b complex vitamins capsule Take 1 capsule by mouth daily.     ferrous sulfate 324 MG TBEC Take 1 tablet (324 mg total) by mouth daily with breakfast. 30 tablet 0   hydrOXYzine (ATARAX) 25 MG tablet Take 25 mg by mouth every 6 (six) hours.     Magnesium Glycinate 120 MG CAPS Take by mouth. 2 capsules daily     ondansetron (ZOFRAN) 4 MG tablet Take 1 tablet (4 mg total) by mouth every 8 (eight) hours as needed for nausea or vomiting. 30 tablet 1   pantoprazole (PROTONIX) 40 MG tablet Take 1 tablet (40 mg total) by mouth daily 30 tablet 1   polyethylene glycol (MIRALAX / GLYCOLAX) 17 g packet Take 17 g by mouth 2 (two) times daily. 180 packet 0   psyllium (METAMUCIL) 58.6 % packet Take 1 packet by mouth 2 (two) times daily. 60 packet 2   tiZANidine (ZANAFLEX) 4 MG tablet Take 4 mg by mouth every 6 (six) hours.     traMADol (ULTRAM) 50 MG tablet Take 50 mg by mouth every 6 (six) hours.     traMADol (ULTRAM) 50 MG tablet Take 1 tablet (50 mg total) by mouth every 6 (six) hours. 60 tablet 0   ursodiol (URSO FORTE) 500 MG tablet Take 1 tablet (500 mg total) by mouth 3 (three) times daily. 90 tablet 5   No current facility-administered medications for this visit.     REVIEW OF SYSTEMS:   Constitutional: Denies fevers, chills or night  sweats Eyes: Denies blurriness of vision Ears, nose, mouth, throat, and face: Denies mucositis or sore throat Respiratory: Denies cough, dyspnea or wheezes Cardiovascular: Denies palpitation, chest discomfort or lower extremity swelling Gastrointestinal:  Denies nausea, heartburn or change in bowel habits Skin: Denies abnormal skin rashes Lymphatics: Denies new lymphadenopathy or easy bruising Neurological:Denies numbness, tingling or new weaknesses Behavioral/Psych: Mood is stable, no new changes  All other systems were reviewed with the patient and are negative.  PHYSICAL EXAMINATION:   Vitals:   09/01/23 0910  BP: 99/76  Pulse: 64  Resp: 16  Temp: 98.6 F (37 C)  SpO2: 99%    GENERAL:alert, no distress and comfortable LUNGS: clear to auscultation and percussion with normal breathing effort HEART: regular rate & rhythm and no murmurs and no lower extremity edema ABDOMEN:abdomen soft, non-tender and normal bowel sounds Musculoskeletal:no cyanosis of digits and no clubbing  NEURO: alert & oriented x  3 with fluent speech  LABORATORY DATA:  I have reviewed the data as listed  Lab Results  Component Value Date   WBC 2.3 (L) 08/25/2023   NEUTROABS 1.1 (L) 08/25/2023   HGB 10.6 (L) 08/25/2023   HCT 32.6 (L) 08/25/2023   MCV 87.9 08/25/2023   PLT 190 08/25/2023      Component Value Date/Time   NA 135 08/25/2023 0908   NA 139 07/25/2023 1043   K 3.6 08/25/2023 0908   CL 101 08/25/2023 0908   CO2 24 08/25/2023 0908   GLUCOSE 104 (H) 08/25/2023 0908   BUN 7 08/25/2023 0908   BUN 8 07/25/2023 1043   CREATININE 0.95 08/25/2023 0908   CREATININE 0.90 06/21/2023 1000   CALCIUM 9.1 08/25/2023 0908   PROT 7.5 08/25/2023 0908   PROT 7.0 07/25/2023 1043   ALBUMIN 3.0 (L) 08/25/2023 0908   ALBUMIN 3.8 (L) 07/25/2023 1043   AST 114 (H) 08/25/2023 0908   ALT 84 (H) 08/25/2023 0908   ALKPHOS 171 (H) 08/25/2023 0908   BILITOT 6.5 (H) 08/25/2023 0908   BILITOT 2.4 (H)  07/25/2023 1043   GFRNONAA >60 08/25/2023 0908   GFRAA >90 08/08/2013 1000       Chemistry      Component Value Date/Time   NA 135 08/25/2023 0908   NA 139 07/25/2023 1043   K 3.6 08/25/2023 0908   CL 101 08/25/2023 0908   CO2 24 08/25/2023 0908   BUN 7 08/25/2023 0908   BUN 8 07/25/2023 1043   CREATININE 0.95 08/25/2023 0908   CREATININE 0.90 06/21/2023 1000      Component Value Date/Time   CALCIUM 9.1 08/25/2023 0908   ALKPHOS 171 (H) 08/25/2023 0908   AST 114 (H) 08/25/2023 0908   ALT 84 (H) 08/25/2023 0908   BILITOT 6.5 (H) 08/25/2023 0908   BILITOT 2.4 (H) 07/25/2023 1043     Lab Results  Component Value Date   IRON 98 08/25/2023   TIBC 311 08/25/2023   FERRITIN 90 08/25/2023

## 2023-09-01 NOTE — Patient Instructions (Signed)
VISIT SUMMARY:  During today's visit, we discussed your ongoing issues with nausea, fatigue, and hematochezia (blood in your stool). We also reviewed your history of primary biliary cirrhosis and anemia. You mentioned experiencing fluctuating energy levels, back pain, and a recent episode of nose bleeding. We have outlined a plan to address these concerns and will continue to monitor your condition closely.  YOUR PLAN:  -PRIMARY BILIARY CIRRHOSIS: Primary biliary cirrhosis is a chronic liver disease that causes the bile ducts in your liver to become inflamed and damaged. We will reach out to Dr. Tasia Catchings to see if we can get an earlier appointment with the gastroenterologist. You will receive two more doses of IV iron to help with your anemia, and we aim to get your ferritin levels above 100. We will repeat your labs in three months.  -HEMATOCHEZIA: Hematochezia means having blood in your stool. You have been experiencing both bright red and dark blood in your stools. We will continue to monitor this, and you have a follow-up appointment with a gastroenterologist scheduled for January.  -GENERAL HEALTH MAINTENANCE: We will review your routine labs and ensure all your studies are normal. Please return for a follow-up in three months. If you experience a fever of 100.58F or higher, seek immediate medical attention.  INSTRUCTIONS:  Please return for a follow-up appointment in three months. If you experience a fever of 100.58F or higher, seek immediate medical attention. Additionally, we will reach out to Dr. Tasia Catchings to try to get an earlier appointment with the gastroenterologist.

## 2023-09-03 ENCOUNTER — Encounter (INDEPENDENT_AMBULATORY_CARE_PROVIDER_SITE_OTHER): Payer: Self-pay | Admitting: Gastroenterology

## 2023-09-03 DIAGNOSIS — K7030 Alcoholic cirrhosis of liver without ascites: Secondary | ICD-10-CM

## 2023-09-03 DIAGNOSIS — R748 Abnormal levels of other serum enzymes: Secondary | ICD-10-CM

## 2023-09-03 DIAGNOSIS — K743 Primary biliary cirrhosis: Secondary | ICD-10-CM

## 2023-09-03 NOTE — Progress Notes (Signed)
Patient with compensated cirrhosis with PBC . After starting ursodiol , ALP have significantly improved but he now has elevated AST>ALT and T. bili elevation .  Spoke with patient over the phone with wife on speaker phone, denies any medications , alcohol use and fever and chills . Denies any confusion or abdominal pain   At this time will order CMP , INR and PETH   Will obtain MRI Abdomen MRCP( PBC patients do get intrahepatic pigmented stones )    ----  If liver enzymes continue be elevated, will stop urso at that time and obtain Liver biopsy to r/o AIH-PBC overlap   Will discuss capsule Endoscopy for blood per rectum at clinic visit

## 2023-09-05 ENCOUNTER — Other Ambulatory Visit (INDEPENDENT_AMBULATORY_CARE_PROVIDER_SITE_OTHER): Payer: Self-pay | Admitting: *Deleted

## 2023-09-05 ENCOUNTER — Telehealth (INDEPENDENT_AMBULATORY_CARE_PROVIDER_SITE_OTHER): Payer: Self-pay | Admitting: Gastroenterology

## 2023-09-05 DIAGNOSIS — K7030 Alcoholic cirrhosis of liver without ascites: Secondary | ICD-10-CM

## 2023-09-05 DIAGNOSIS — R748 Abnormal levels of other serum enzymes: Secondary | ICD-10-CM

## 2023-09-05 DIAGNOSIS — K743 Primary biliary cirrhosis: Secondary | ICD-10-CM

## 2023-09-05 NOTE — Telephone Encounter (Signed)
MRI approved via insurance. Auth number [id number] valid from 09/05/23-10/04/23. Contacted central scheduling and pt is scheduled for 09/12/23 at 8 am. Arrive 15 min early. Npo 4 hours prior including no water. Contacted pt and he is aware of MRI appt and lab orders

## 2023-09-05 NOTE — Telephone Encounter (Signed)
Antonio Macho, MD  Marlowe Shores, LPN; Metro Kung, LPN Sender has requested a call back   Toniann Fail ; I have ordered CMP , INR and PETH for this patient , I think they need to pick up slip for lab work on Monday  Shaelynn Dragos : can you please arrange MRI abdomen MRCP diagnosis : r/o choledocholithiases , elevated liver enzymes

## 2023-09-07 LAB — LACTATE DEHYDROGENASE: LDH: 113 [IU]/L — ABNORMAL LOW (ref 121–224)

## 2023-09-08 ENCOUNTER — Encounter (INDEPENDENT_AMBULATORY_CARE_PROVIDER_SITE_OTHER): Payer: Self-pay | Admitting: Gastroenterology

## 2023-09-10 ENCOUNTER — Other Ambulatory Visit (INDEPENDENT_AMBULATORY_CARE_PROVIDER_SITE_OTHER): Payer: Self-pay | Admitting: Gastroenterology

## 2023-09-10 ENCOUNTER — Other Ambulatory Visit (HOSPITAL_BASED_OUTPATIENT_CLINIC_OR_DEPARTMENT_OTHER): Payer: Self-pay

## 2023-09-12 ENCOUNTER — Other Ambulatory Visit (INDEPENDENT_AMBULATORY_CARE_PROVIDER_SITE_OTHER): Payer: Self-pay | Admitting: Gastroenterology

## 2023-09-12 ENCOUNTER — Other Ambulatory Visit (HOSPITAL_BASED_OUTPATIENT_CLINIC_OR_DEPARTMENT_OTHER): Payer: Self-pay

## 2023-09-12 ENCOUNTER — Ambulatory Visit (HOSPITAL_COMMUNITY)
Admission: RE | Admit: 2023-09-12 | Discharge: 2023-09-12 | Disposition: A | Discharge status: Home / Self Care | Payer: BC Managed Care – PPO | Source: Ambulatory Visit | Attending: Gastroenterology | Admitting: Gastroenterology

## 2023-09-12 ENCOUNTER — Other Ambulatory Visit: Payer: Self-pay

## 2023-09-12 DIAGNOSIS — R748 Abnormal levels of other serum enzymes: Secondary | ICD-10-CM | POA: Insufficient documentation

## 2023-09-12 DIAGNOSIS — K743 Primary biliary cirrhosis: Secondary | ICD-10-CM | POA: Insufficient documentation

## 2023-09-12 DIAGNOSIS — K7030 Alcoholic cirrhosis of liver without ascites: Secondary | ICD-10-CM | POA: Insufficient documentation

## 2023-09-12 MED ORDER — GADOBUTROL 1 MMOL/ML IV SOLN
10.0000 mL | Freq: Once | INTRAVENOUS | Status: AC | PRN
  Administered 2023-09-12: 10 mL via INTRAVENOUS

## 2023-09-12 MED ORDER — PANTOPRAZOLE SODIUM 40 MG PO TBEC
40.0000 mg | DELAYED_RELEASE_TABLET | Freq: Every day | ORAL | 1 refills | Status: DC
  Filled 2023-09-12: qty 30, 30d supply, fill #0
  Filled 2023-10-15: qty 30, 30d supply, fill #1

## 2023-09-12 MED ORDER — GADOBUTROL 1 MMOL/ML IV SOLN: 9.0000 mL | Freq: Once | INTRAVENOUS | Status: DC | PRN

## 2023-09-12 NOTE — Telephone Encounter (Signed)
Last OV 07/25/23 - note states use protonix 40mg  po every day and EGD in 12 weeks. Pt requesting 90 day supply of med.

## 2023-09-13 ENCOUNTER — Other Ambulatory Visit (HOSPITAL_BASED_OUTPATIENT_CLINIC_OR_DEPARTMENT_OTHER): Payer: Self-pay

## 2023-09-13 ENCOUNTER — Inpatient Hospital Stay: Payer: BC Managed Care – PPO

## 2023-09-13 VITALS — BP 133/69 | HR 77 | Temp 97.0°F | Resp 18

## 2023-09-13 DIAGNOSIS — D500 Iron deficiency anemia secondary to blood loss (chronic): Secondary | ICD-10-CM | POA: Diagnosis not present

## 2023-09-13 MED ORDER — SODIUM CHLORIDE 0.9 % IV SOLN: Freq: Once | INTRAVENOUS | Status: AC

## 2023-09-13 MED ORDER — SODIUM CHLORIDE 0.9 % IV SOLN
400.0000 mg | Freq: Once | INTRAVENOUS | Status: AC
  Administered 2023-09-13: 400 mg via INTRAVENOUS
  Filled 2023-09-13: qty 400

## 2023-09-13 NOTE — Progress Notes (Signed)
Patient presents today for iron infusion.  Patient is in satisfactory condition with no new complaints voiced.  Vital signs are stable.  Benadryl and Tylenol taken at 1015 prior to visit.  IV placed in L arm.  IV flushed well with good blood return noted.  We will proceed with infusion per provider orders.    Patient tolerated infusion well with no complaints voiced.  Patient left ambulatory in stable condition.  Vital signs stable at discharge.  Follow up as scheduled.

## 2023-09-13 NOTE — Patient Instructions (Signed)
[facility] - A DEPT OF [facility]  Discharge Instructions: Thank you for choosing [facility] to provide your oncology and hematology care.  If you have a lab appointment with the Cancer Center - please note that after April 8th, 2024, all labs will be drawn in the cancer center.  You do not have to check in or register with the main entrance as you have in the past but will complete your check-in in the cancer center.  Wear comfortable clothing and clothing appropriate for easy access to any Portacath or PICC line.   We strive to give you quality time with your provider. You may need to reschedule your appointment if you arrive late (15 or more minutes).  Arriving late affects you and other patients whose appointments are after yours.  Also, if you miss three or more appointments without notifying the office, you may be dismissed from the clinic at the provider's discretion.      For prescription refill requests, have your pharmacy contact our office and allow 72 hours for refills to be completed.    Today you received the following:  Venofer.  Iron Sucrose Injection What is this medication? IRON SUCROSE (EYE ern SOO krose) treats low levels of iron (iron deficiency anemia) in people with kidney disease. Iron is a mineral that plays an important role in making red blood cells, which carry oxygen from your lungs to the rest of your body. This medicine may be used for other purposes; ask your health care provider or pharmacist if you have questions. COMMON BRAND NAME(S): Venofer What should I tell my care team before I take this medication? They need to know if you have any of these conditions: Anemia not caused by low iron levels Heart disease High levels of iron in the blood Kidney disease Liver disease An unusual or allergic reaction to iron, other medications, foods, dyes, or preservatives Pregnant or trying to get  pregnant Breastfeeding How should I use this medication? This medication is for infusion into a vein. It is given in a hospital or clinic setting. Talk to your care team about the use of this medication in children. While this medication may be prescribed for children as young as 2 years for selected conditions, precautions do apply. Overdosage: If you think you have taken too much of this medicine contact a poison control center or emergency room at once. NOTE: This medicine is only for you. Do not share this medicine with others. What if I miss a dose? Keep appointments for follow-up doses. It is important not to miss your dose. Call your care team if you are unable to keep an appointment. What may interact with this medication? Do not take this medication with any of the following: Deferoxamine Dimercaprol Other iron products This medication may also interact with the following: Chloramphenicol Deferasirox This list may not describe all possible interactions. Give your health care provider a list of all the medicines, herbs, non-prescription drugs, or dietary supplements you use. Also tell them if you smoke, drink alcohol, or use illegal drugs. Some items may interact with your medicine. What should I watch for while using this medication? Visit your care team regularly. Tell your care team if your symptoms do not start to get better or if they get worse. You may need blood work done while you are taking this medication. You may need to follow a special diet. Talk to your care team. Foods that  contain iron include: whole grains/cereals, dried fruits, beans, or peas, leafy green vegetables, and organ meats (liver, kidney). What side effects may I notice from receiving this medication? Side effects that you should report to your care team as soon as possible: Allergic reactions--skin rash, itching, hives, swelling of the face, lips, tongue, or throat Low blood pressure--dizziness, feeling  faint or lightheaded, blurry vision Shortness of breath Side effects that usually do not require medical attention (report to your care team if they continue or are bothersome): Flushing Headache Joint pain Muscle pain Nausea Pain, redness, or irritation at injection site This list may not describe all possible side effects. Call your doctor for medical advice about side effects. You may report side effects to FDA at 1-800-FDA-1088. Where should I keep my medication? This medication is given in a hospital or clinic. It will not be stored at home. NOTE: This sheet is a summary. It may not cover all possible information. If you have questions about this medicine, talk to your doctor, pharmacist, or health care provider.  2024 Elsevier/Gold Standard (2023-03-11 00:00:00)     To help prevent nausea and vomiting after your treatment, we encourage you to take your nausea medication as directed.  BELOW ARE SYMPTOMS THAT SHOULD BE REPORTED IMMEDIATELY: *FEVER GREATER THAN 100.4 F (38 C) OR HIGHER *CHILLS OR SWEATING *NAUSEA AND VOMITING THAT IS NOT CONTROLLED WITH YOUR NAUSEA MEDICATION *UNUSUAL SHORTNESS OF BREATH *UNUSUAL BRUISING OR BLEEDING *URINARY PROBLEMS (pain or burning when urinating, or frequent urination) *BOWEL PROBLEMS (unusual diarrhea, constipation, pain near the anus) TENDERNESS IN MOUTH AND THROAT WITH OR WITHOUT PRESENCE OF ULCERS (sore throat, sores in mouth, or a toothache) UNUSUAL RASH, SWELLING OR PAIN  UNUSUAL VAGINAL DISCHARGE OR ITCHING   Items with * indicate a potential emergency and should be followed up as soon as possible or go to the Emergency Department if any problems should occur.  Please show the CHEMOTHERAPY ALERT CARD or IMMUNOTHERAPY ALERT CARD at check-in to the Emergency Department and triage nurse.  Should you have questions after your visit or need to cancel or reschedule your appointment, please contact  [facility] - A DEPT OF  [facility]igh[facility] Bridegroom [facility] [phone]  and follow the prompts.  Office hours are 8:00 a.m. to 4:30 p.m. Monday - Friday. Please note that voicemails left after 4:00 p.m. may not be returned until the following business day.  We are closed weekends and major holidays. You have access to a nurse at all times for urgent questions. Please call the main number to the clinic [phone] and follow the prompts.  For any non-urgent questions, you may also contact your provider using MyChart. We now offer e-Visits for anyone 45 and older to request care online for non-urgent symptoms. For details visit [email or web address].   Also download the MyChart app! Go to the app store, search "MyChart", open the app, select [place], and log in with your MyChart username and password.

## 2023-09-14 ENCOUNTER — Encounter (INDEPENDENT_AMBULATORY_CARE_PROVIDER_SITE_OTHER): Payer: Self-pay | Admitting: *Deleted

## 2023-09-14 ENCOUNTER — Other Ambulatory Visit (HOSPITAL_BASED_OUTPATIENT_CLINIC_OR_DEPARTMENT_OTHER): Payer: Self-pay

## 2023-09-14 ENCOUNTER — Encounter: Payer: Self-pay | Admitting: Oncology

## 2023-09-14 LAB — COMPREHENSIVE METABOLIC PANEL
ALT: 42 [IU]/L (ref 0–44)
AST: 60 [IU]/L — ABNORMAL HIGH (ref 0–40)
Albumin: 3.5 g/dL — ABNORMAL LOW (ref 4.1–5.1)
Alkaline Phosphatase: 184 [IU]/L — ABNORMAL HIGH (ref 44–121)
BUN/Creatinine Ratio: 6 — ABNORMAL LOW (ref 9–20)
BUN: 6 mg/dL (ref 6–24)
Bilirubin Total: 6.2 mg/dL — ABNORMAL HIGH (ref 0.0–1.2)
CO2: 22 mmol/L (ref 20–29)
Calcium: 9.3 mg/dL (ref 8.7–10.2)
Chloride: 101 mmol/L (ref 96–106)
Creatinine, Ser: 0.99 mg/dL (ref 0.76–1.27)
Globulin, Total: 3.5 g/dL (ref 1.5–4.5)
Glucose: 75 mg/dL (ref 70–99)
Potassium: 3.3 mmol/L — ABNORMAL LOW (ref 3.5–5.2)
Sodium: 138 mmol/L (ref 134–144)
Total Protein: 7 g/dL (ref 6.0–8.5)
eGFR: 95 mL/min/{1.73_m2} (ref >59)

## 2023-09-14 LAB — PHOSPHATIDYLETHANOL (PETH)
Phosphatidylethanol (PEth): NEGATIVE ng/mL
Phosphatidylethanol: NEGATIVE

## 2023-09-14 LAB — BILIRUBIN, FRACTIONATED(TOT/DIR/INDIR)
Bilirubin, Direct: 4.78 mg/dL — ABNORMAL HIGH (ref 0.00–0.40)
Bilirubin, Indirect: 1.42 mg/dL — ABNORMAL HIGH (ref 0.10–0.80)

## 2023-09-14 LAB — PROTIME-INR
INR: 1.2 (ref 0.9–1.2)
Prothrombin Time: 12.9 s — ABNORMAL HIGH (ref 9.1–12.0)

## 2023-09-14 MED ORDER — TRAMADOL HCL 50 MG PO TABS
50.0000 mg | ORAL_TABLET | Freq: Four times a day (QID) | ORAL | 1 refills | Status: DC
  Filled 2023-09-14: qty 60, 15d supply, fill #0
  Filled 2023-10-24 (×2): qty 60, 15d supply, fill #1

## 2023-09-14 NOTE — Progress Notes (Signed)
Hi Antonio Allen ,  Can you please call the patient and tell the patient the lab work continues to show elevated liver enzymes but it is slightly better than before  MRI did not show any stones, which is good news  It does show cirrhosis and a possible liver lesion which is very small  I recommend following with me in the clinic as scheduled and continue taking the medications  Thanks,  [name], MD Gastroenterology and Hepatology [facility] Gastroenterology   ===============  Patient with persistently elevated T. bili, alk phos and improved after ursodiol addition, but continues to have AST and intermittent ALT elevation.  Peth negative hence patient has been without any alcohol use for many months  MRI without any choledocholithiasis but demonstrated 1 cm my LIRAD 3 lesion.  Given small size and also as recommended by radiologist recommend follow-up MRI hepatic protocol in 6 months  Given persistently elevated liver enzymes, cirrhosis and PBC will discuss with patient to obtain liver biopsy to evaluate for AIH-PBC overlap  Also given small volume perihepatic ascites which signifies clinically significant portal hypertension we discussed starting patient on beta-blocker  We also discussed hematochezia and capsule endoscopy

## 2023-09-21 ENCOUNTER — Ambulatory Visit (INDEPENDENT_AMBULATORY_CARE_PROVIDER_SITE_OTHER): Payer: BC Managed Care – PPO | Admitting: Gastroenterology

## 2023-09-22 ENCOUNTER — Ambulatory Visit (INDEPENDENT_AMBULATORY_CARE_PROVIDER_SITE_OTHER): Payer: BC Managed Care – PPO | Admitting: Gastroenterology

## 2023-09-23 ENCOUNTER — Other Ambulatory Visit (HOSPITAL_BASED_OUTPATIENT_CLINIC_OR_DEPARTMENT_OTHER): Payer: Self-pay

## 2023-09-30 ENCOUNTER — Inpatient Hospital Stay: Payer: BC Managed Care – PPO | Attending: Hematology

## 2023-09-30 VITALS — BP 128/74 | HR 61 | Temp 97.6°F | Resp 18 | Ht 73.0 in | Wt 229.0 lb

## 2023-09-30 DIAGNOSIS — D509 Iron deficiency anemia, unspecified: Secondary | ICD-10-CM | POA: Diagnosis present

## 2023-09-30 DIAGNOSIS — Z79899 Other long term (current) drug therapy: Secondary | ICD-10-CM | POA: Diagnosis not present

## 2023-09-30 DIAGNOSIS — D500 Iron deficiency anemia secondary to blood loss (chronic): Secondary | ICD-10-CM

## 2023-09-30 MED ORDER — SODIUM CHLORIDE 0.9 % IV SOLN: Freq: Once | INTRAVENOUS | Status: AC

## 2023-09-30 MED ORDER — DIPHENHYDRAMINE HCL 25 MG PO CAPS
25.0000 mg | ORAL_CAPSULE | Freq: Once | ORAL | Status: DC
Start: 2023-09-30 — End: 2023-09-30

## 2023-09-30 MED ORDER — SODIUM CHLORIDE 0.9 % IV SOLN
400.0000 mg | Freq: Once | INTRAVENOUS | Status: AC
  Administered 2023-09-30: 400 mg via INTRAVENOUS
  Filled 2023-09-30: qty 400

## 2023-09-30 NOTE — Patient Instructions (Signed)
Iron Sucrose Injection What is this medication? IRON SUCROSE (EYE ern SOO krose) treats low levels of iron (iron deficiency anemia) in people with kidney disease. Iron is a mineral that plays an important role in making red blood cells, which carry oxygen from your lungs to the rest of your body. This medicine may be used for other purposes; ask your health care provider or pharmacist if you have questions. COMMON BRAND NAME(S): Venofer What should I tell my care team before I take this medication? They need to know if you have any of these conditions: Anemia not caused by low iron levels Heart disease High levels of iron in the blood Kidney disease Liver disease An unusual or allergic reaction to iron, other medications, foods, dyes, or preservatives Pregnant or trying to get pregnant Breastfeeding How should I use this medication? This medication is for infusion into a vein. It is given in a hospital or clinic setting. Talk to your care team about the use of this medication in children. While this medication may be prescribed for children as young as 2 years for selected conditions, precautions do apply. Overdosage: If you think you have taken too much of this medicine contact a poison control center or emergency room at once. NOTE: This medicine is only for you. Do not share this medicine with others. What if I miss a dose? Keep appointments for follow-up doses. It is important not to miss your dose. Call your care team if you are unable to keep an appointment. What may interact with this medication? Do not take this medication with any of the following: Deferoxamine Dimercaprol Other iron products This medication may also interact with the following: Chloramphenicol Deferasirox This list may not describe all possible interactions. Give your health care provider a list of all the medicines, herbs, non-prescription drugs, or dietary supplements you use. Also tell them if you smoke,  drink alcohol, or use illegal drugs. Some items may interact with your medicine. What should I watch for while using this medication? Visit your care team regularly. Tell your care team if your symptoms do not start to get better or if they get worse. You may need blood work done while you are taking this medication. You may need to follow a special diet. Talk to your care team. Foods that contain iron include: whole grains/cereals, dried fruits, beans, or peas, leafy green vegetables, and organ meats (liver, kidney). What side effects may I notice from receiving this medication? Side effects that you should report to your care team as soon as possible: Allergic reactions--skin rash, itching, hives, swelling of the face, lips, tongue, or throat Low blood pressure--dizziness, feeling faint or lightheaded, blurry vision Shortness of breath Side effects that usually do not require medical attention (report to your care team if they continue or are bothersome): Flushing Headache Joint pain Muscle pain Nausea Pain, redness, or irritation at injection site This list may not describe all possible side effects. Call your doctor for medical advice about side effects. You may report side effects to FDA at 1-800-FDA-1088. Where should I keep my medication? This medication is given in a hospital or clinic. It will not be stored at home. NOTE: This sheet is a summary. It may not cover all possible information. If you have questions about this medicine, talk to your doctor, pharmacist, or health care provider.  2024 Elsevier/Gold Standard (2023-03-11 00:00:00)

## 2023-09-30 NOTE — Progress Notes (Signed)
Patient tolerated iron infusion with no complaints voiced.  Peripheral IV site clean and dry with good blood return noted before and after infusion.  Band aid applied.  Pt observed for 30 minutes post iron infusion with no complications. VSS with discharge and left in satisfactory condition with no s/s of distress noted. All follow ups as scheduled.   Antonio Allen [employer]

## 2023-10-11 ENCOUNTER — Other Ambulatory Visit (HOSPITAL_BASED_OUTPATIENT_CLINIC_OR_DEPARTMENT_OTHER): Payer: Self-pay

## 2023-10-11 MED ORDER — TIZANIDINE HCL 4 MG PO TABS
4.0000 mg | ORAL_TABLET | Freq: Four times a day (QID) | ORAL | 4 refills | Status: DC
  Filled 2023-10-11 – 2023-10-21 (×2): qty 56, 14d supply, fill #0

## 2023-10-17 ENCOUNTER — Encounter: Payer: Self-pay | Admitting: Oncology

## 2023-10-18 ENCOUNTER — Ambulatory Visit (INDEPENDENT_AMBULATORY_CARE_PROVIDER_SITE_OTHER): Payer: BC Managed Care – PPO | Admitting: Gastroenterology

## 2023-10-21 ENCOUNTER — Other Ambulatory Visit (HOSPITAL_BASED_OUTPATIENT_CLINIC_OR_DEPARTMENT_OTHER): Payer: Self-pay

## 2023-10-24 ENCOUNTER — Other Ambulatory Visit: Payer: Self-pay

## 2023-10-24 ENCOUNTER — Encounter: Payer: Self-pay | Admitting: Oncology

## 2023-10-24 ENCOUNTER — Other Ambulatory Visit (HOSPITAL_BASED_OUTPATIENT_CLINIC_OR_DEPARTMENT_OTHER): Payer: Self-pay

## 2023-10-25 ENCOUNTER — Ambulatory Visit (INDEPENDENT_AMBULATORY_CARE_PROVIDER_SITE_OTHER): Payer: BC Managed Care – PPO | Admitting: Gastroenterology

## 2023-10-25 ENCOUNTER — Encounter (INDEPENDENT_AMBULATORY_CARE_PROVIDER_SITE_OTHER): Payer: Self-pay | Admitting: Gastroenterology

## 2023-10-26 ENCOUNTER — Ambulatory Visit (INDEPENDENT_AMBULATORY_CARE_PROVIDER_SITE_OTHER): Payer: BC Managed Care – PPO | Admitting: Gastroenterology

## 2023-11-02 ENCOUNTER — Other Ambulatory Visit (HOSPITAL_BASED_OUTPATIENT_CLINIC_OR_DEPARTMENT_OTHER): Payer: Self-pay

## 2023-11-07 ENCOUNTER — Other Ambulatory Visit (HOSPITAL_BASED_OUTPATIENT_CLINIC_OR_DEPARTMENT_OTHER): Payer: Self-pay

## 2023-11-09 ENCOUNTER — Other Ambulatory Visit: Payer: Self-pay

## 2023-11-09 ENCOUNTER — Other Ambulatory Visit (HOSPITAL_BASED_OUTPATIENT_CLINIC_OR_DEPARTMENT_OTHER): Payer: Self-pay

## 2023-11-09 ENCOUNTER — Encounter (HOSPITAL_BASED_OUTPATIENT_CLINIC_OR_DEPARTMENT_OTHER): Payer: Self-pay

## 2023-11-09 MED ORDER — LORAZEPAM 0.5 MG PO TABS
0.5000 mg | ORAL_TABLET | Freq: Two times a day (BID) | ORAL | 0 refills | Status: AC
  Filled 2023-11-09: qty 60, 30d supply, fill #0

## 2023-11-09 MED ORDER — ESCITALOPRAM OXALATE 10 MG PO TABS
10.0000 mg | ORAL_TABLET | Freq: Every day | ORAL | 1 refills | Status: DC
  Filled 2023-11-09: qty 90, 90d supply, fill #0

## 2023-11-10 ENCOUNTER — Other Ambulatory Visit (HOSPITAL_BASED_OUTPATIENT_CLINIC_OR_DEPARTMENT_OTHER): Payer: Self-pay

## 2023-11-10 ENCOUNTER — Other Ambulatory Visit: Payer: Self-pay

## 2023-11-10 MED ORDER — VITAMIN D (ERGOCALCIFEROL) 1.25 MG (50000 UNIT) PO CAPS
50000.0000 [IU] | ORAL_CAPSULE | ORAL | 0 refills | Status: DC
  Filled 2023-11-10: qty 12, 84d supply, fill #0

## 2023-11-14 ENCOUNTER — Other Ambulatory Visit (HOSPITAL_BASED_OUTPATIENT_CLINIC_OR_DEPARTMENT_OTHER): Payer: Self-pay

## 2023-11-16 ENCOUNTER — Encounter (INDEPENDENT_AMBULATORY_CARE_PROVIDER_SITE_OTHER): Payer: Self-pay | Admitting: Gastroenterology

## 2023-11-17 NOTE — Telephone Encounter (Signed)
Last OV 07/25/23 and next appt is 11/21/22

## 2023-11-22 ENCOUNTER — Ambulatory Visit (INDEPENDENT_AMBULATORY_CARE_PROVIDER_SITE_OTHER): Payer: 59 | Admitting: Gastroenterology

## 2023-11-22 ENCOUNTER — Other Ambulatory Visit (HOSPITAL_BASED_OUTPATIENT_CLINIC_OR_DEPARTMENT_OTHER): Payer: Self-pay

## 2023-11-22 ENCOUNTER — Encounter: Payer: Self-pay | Admitting: Oncology

## 2023-11-22 ENCOUNTER — Encounter (INDEPENDENT_AMBULATORY_CARE_PROVIDER_SITE_OTHER): Payer: Self-pay | Admitting: Gastroenterology

## 2023-11-22 ENCOUNTER — Telehealth (INDEPENDENT_AMBULATORY_CARE_PROVIDER_SITE_OTHER): Payer: Self-pay | Admitting: Gastroenterology

## 2023-11-22 VITALS — BP 123/65 | HR 73 | Temp 98.4°F | Ht 73.0 in | Wt 211.9 lb

## 2023-11-22 DIAGNOSIS — L299 Pruritus, unspecified: Secondary | ICD-10-CM

## 2023-11-22 DIAGNOSIS — F1010 Alcohol abuse, uncomplicated: Secondary | ICD-10-CM

## 2023-11-22 DIAGNOSIS — K769 Liver disease, unspecified: Secondary | ICD-10-CM

## 2023-11-22 DIAGNOSIS — K5900 Constipation, unspecified: Secondary | ICD-10-CM

## 2023-11-22 DIAGNOSIS — D509 Iron deficiency anemia, unspecified: Secondary | ICD-10-CM

## 2023-11-22 DIAGNOSIS — R5383 Other fatigue: Secondary | ICD-10-CM

## 2023-11-22 DIAGNOSIS — K7460 Unspecified cirrhosis of liver: Secondary | ICD-10-CM | POA: Diagnosis not present

## 2023-11-22 DIAGNOSIS — K7030 Alcoholic cirrhosis of liver without ascites: Secondary | ICD-10-CM

## 2023-11-22 DIAGNOSIS — R7989 Other specified abnormal findings of blood chemistry: Secondary | ICD-10-CM | POA: Diagnosis not present

## 2023-11-22 DIAGNOSIS — R630 Anorexia: Secondary | ICD-10-CM

## 2023-11-22 DIAGNOSIS — R112 Nausea with vomiting, unspecified: Secondary | ICD-10-CM

## 2023-11-22 DIAGNOSIS — R634 Abnormal weight loss: Secondary | ICD-10-CM

## 2023-11-22 DIAGNOSIS — K766 Portal hypertension: Secondary | ICD-10-CM

## 2023-11-22 DIAGNOSIS — K743 Primary biliary cirrhosis: Secondary | ICD-10-CM

## 2023-11-22 MED ORDER — CARVEDILOL 3.125 MG PO TABS
3.1250 mg | ORAL_TABLET | Freq: Two times a day (BID) | ORAL | 0 refills | Status: DC
  Filled 2023-11-22: qty 180, 90d supply, fill #0

## 2023-11-22 MED ORDER — URSODIOL 500 MG PO TABS
500.0000 mg | ORAL_TABLET | Freq: Three times a day (TID) | ORAL | 5 refills | Status: DC
  Filled 2023-11-22: qty 90, 30d supply, fill #0
  Filled 2024-01-09: qty 90, 30d supply, fill #1
  Filled 2024-02-16: qty 90, 30d supply, fill #2
  Filled 2024-03-24 (×2): qty 90, 30d supply, fill #3
  Filled 2024-04-30: qty 90, 30d supply, fill #4
  Filled 2024-05-30 (×2): qty 90, 30d supply, fill #5

## 2023-11-22 MED ORDER — CHOLESTYRAMINE 4 G PO PACK
4.0000 g | PACK | Freq: Two times a day (BID) | ORAL | 2 refills | Status: DC
  Filled 2023-11-22: qty 60, 30d supply, fill #0

## 2023-11-22 NOTE — Patient Instructions (Addendum)
 It was very nice to meet you today, as dicussed with will plan for the following :  1) Upper endoscopy with capsule endoscopy  2) Labs in 1 month  3) Start Carvedilol  3.125 twice daily  4) ursodiol   5) Ultrasound scortum  6) Endocrinology referral for elevates testosterone   7) MRI Abdomen in march   8) Start cholestyramine  for itching away from all meds

## 2023-11-22 NOTE — Telephone Encounter (Signed)
 Patient with PBC complicated with Cirrhosis , now with LIRAD 3 lesion , incidentally elevated testosterone  ,signs of pHTN. MELD 16 . Patient agreeable for transplant evaluation at [facility] , They have office in [place] can you please refer the patient ?

## 2023-11-22 NOTE — H&P (View-Only) (Signed)
 Antonio Allen , M.D. Gastroenterology & Hepatology [facility] Gastroenterology [address] [place], [place] [address] Primary Care Physician: Marvine Rush, MD [address] [place] [address]  Chief Complaint:  unintentional weight loss, decreased appetite, nausea and vomiting, fatigue and generalized pruritus   cirrhotic, elevated liver enzymes and possible hepatic lesion, iron  deficiency anemia, painless hematochezia , PBC  History of Present Illness: Antonio Allen is a 48 y.o. male with GERD , alcohol use disorder (been sober for 5 months -v PETH), compensated cirrhosis now with signs of pHTN , previous hospitalization for severe anemia hemoglobin 4.9 status post an upper endoscopy and colonoscopy , who presents for follow up of anemia , PBC complicated with cirrhosis , elevated liver enzymes,LIRAD 3 hepatic lesion, iron  deficiency anemia   Patient had a positive AMA , chronic alk phos elevated and was started on ursodiol  with clinical diagnosis of PBC. After starting ursodiol  , ALP improved but he now had elevated AST>ALT and T. bili elevation . This has improved in most recent blood work  Denies any medications , alcohol use and fever and chills . Denies any confusion or abdominal pain . MRI Abdomen MRCP( PBC patients do get intrahepatic pigmented stones ) which demonstrated cirrhosis with stigmata of portal hypertension and LIRAD 3 Lesion  Patient main complaint today remains unintentional weight loss, decreased appetite, nausea and vomiting, fatigue and generalized pruritus  Patient have not had alcohol for past 5 months, not even single drink.Previously he was drinking vodka daily for many years.  Previous complaints of hematochezia has resolved   Continues to complain of fatigue. Patient was not able to tolerate hydroxyzine    Last EGD:06/07/2023  Patchy severe inflammation with hemorrhage characterized by adherent blood, congestion (  edema) , erythema and mucus was found in the gastric fundus and in the gastric body.  - Normal esophagus.  - Gastritis with hemorrhage.  Treated with argon plasma coagulation (APC).  - gastric ulcer , Scar in the gastric antrum.  Biopsied.  - Duodenitis.   A. GASTRIC, BIOPSY: Antral and oxyntic mucosa with reactive gastropathy. Negative for Helicobacter pylori.  Last Colonoscopy:06/09/2023  - No evidence of old or active bleeding throughout the colon and 10 cm into Ileum - Diverticulosis in the left colon.  - Non- bleeding external and internal hemorrhoids.  - No specimens collected.  Negative ANA, ASMA AFP<1.8 1.8 IgG 2400 Ceruloplasmin elevated  Hep A IgM negative hep B nonexposed hep C negative.  HIV negative Ferritin 13 Hemoglobin 7.5 MCV 82  FHx: neg for any gastrointestinal/liver disease, no malignancies Social: Daily alcohol use.  20 days ago   Past Medical History: Past Medical History:  Diagnosis Date   Alcohol dependence (HCC)    Atypical mole 05/30/2018   moderate atypia on left upper arm   Atypical mole 05/30/2018   mild atypia on mid chest   GERD (gastroesophageal reflux disease)     Past Surgical History: Past Surgical History:  Procedure Laterality Date   BIOPSY  06/07/2023   Procedure: BIOPSY;  Surgeon: Eartha Angelia Sieving, MD;  Location: [facility];  Service: Gastroenterology;;   CERVICAL FUSION     C6   COLONOSCOPY WITH PROPOFOL  N/A 06/09/2023   Procedure: COLONOSCOPY WITH PROPOFOL ;  Surgeon: Cinderella Deatrice FALCON, MD;  Location: [facility];  Service: Endoscopy;  Laterality: N/A;   ESOPHAGOGASTRODUODENOSCOPY (EGD) WITH PROPOFOL  N/A 06/07/2023   Procedure: ESOPHAGOGASTRODUODENOSCOPY (EGD) WITH PROPOFOL ;  Surgeon: Eartha Angelia Sieving, MD;  Location:  [facility];  Service: Gastroenterology;  Laterality: N/A;   HOT HEMOSTASIS  06/07/2023   Procedure: HOT HEMOSTASIS (ARGON PLASMA COAGULATION/BICAP);  Surgeon: Eartha Flavors, Toribio,  MD;  Location: [facility];  Service: Gastroenterology;;   ORIF ANKLE FRACTURE Left 08/08/2013   Procedure: OPEN REDUCTION INTERNAL FIXATION (ORIF) LEFT ANKLE FRACTURE;  Surgeon: Tanda DELENA Heading, MD;  Location: [facility];  Service: Orthopedics;  Laterality: Left;   TONSILLECTOMY      Family History:History reviewed. No pertinent family history.  Social History: Social History   Tobacco Use  Smoking Status Never   Passive exposure: Past  Smokeless Tobacco Never   Social History   Substance and Sexual Activity  Alcohol Use Not Currently   Comment: 1 fifth of liquor daily   Social History   Substance and Sexual Activity  Drug Use No    Allergies: Allergies  Allergen Reactions   Bee Venom Swelling    Medications: Current Outpatient Medications  Medication Sig Dispense Refill   carvedilol  (COREG ) 3.125 MG tablet Take 1 tablet (3.125 mg total) by mouth 2 (two) times daily with a meal. 180 tablet 0   cholestyramine  (QUESTRAN ) 4 g packet Take 1 packet (4 g total) by mouth 2 (two) times daily. 60 each 2   LORazepam  (ATIVAN ) 0.5 MG tablet Take 1 tablet (0.5 mg total) by mouth 2 (two) times daily as needed 60 tablet 0   ondansetron  (ZOFRAN ) 4 MG tablet Take 1 tablet (4 mg total) by mouth every 8 (eight) hours as needed for nausea or vomiting. 30 tablet 1   pantoprazole  (PROTONIX ) 40 MG tablet Take 40 mg by mouth daily.     tiZANidine  (ZANAFLEX ) 4 MG tablet Take 4 mg by mouth every 6 (six) hours.     traMADol  (ULTRAM ) 50 MG tablet Take 50 mg by mouth every 6 (six) hours.     Vitamin D , Ergocalciferol , (DRISDOL ) 1.25 MG (50000 UNIT) CAPS capsule Take 1 capsule (50,000 Units total) by mouth every 7 (seven) days. Then take 2000 units OTC daily 12 capsule 0   escitalopram  (LEXAPRO ) 10 MG tablet Take 1 tablet (10 mg total) by mouth daily. (Patient not taking: Reported on 11/22/2023) 90 tablet 1   hydrOXYzine  (ATARAX ) 25 MG tablet Take 25 mg by mouth every 6 (six) hours. (Patient not  taking: Reported on 11/22/2023)     ursodiol  (URSO  FORTE) 500 MG tablet Take 1 tablet (500 mg total) by mouth 3 (three) times daily. 90 tablet 5   No current facility-administered medications for this visit.    Review of Systems: GENERAL: negative for malaise, night sweats HEENT: No changes in hearing or vision, no nose bleeds or other nasal problems. NECK: Negative for lumps, goiter, pain and significant neck swelling RESPIRATORY: Negative for cough, wheezing CARDIOVASCULAR: Negative for chest pain, leg swelling, palpitations, orthopnea GI: SEE HPI MUSCULOSKELETAL: Negative for joint pain or swelling, back pain, and muscle pain. SKIN: Negative for lesions, rash HEMATOLOGY Negative for prolonged bleeding, bruising easily, and swollen nodes. ENDOCRINE: Negative for cold or heat intolerance, polyuria, polydipsia and goiter. NEURO: negative for tremor, gait imbalance, syncope and seizures. The remainder of the review of systems is noncontributory.   Physical Exam: BP 123/65 (BP Location: Left Arm, Patient Position: Sitting, Cuff Size: Normal)   Pulse 73   Temp 98.4 F (36.9 C) (Temporal)   Ht 6' 1 (1.854 m)   Wt 211 lb 14.4 oz (96.1 kg)   BMI 27.96 kg/m  GENERAL: The patient is AO  x3, in no acute distress. HEENT: Head is normocephalic and atraumatic. EOMI are intact. Mouth is well hydrated and without lesions. NECK: Supple. No masses LUNGS: Clear to auscultation. No presence of rhonchi/wheezing/rales. Adequate chest expansion HEART: RRR, normal s1 and s2. ABDOMEN: Soft, nontender, no guarding, no peritoneal signs, and nondistended. BS +. No masses. EXTREMITIES: Without any cyanosis, clubbing, rash, lesions or edema. NEUROLOGIC: AOx3, no focal motor deficit. SKIN: no jaundice, no rashes   Imaging/Labs: as above     Latest Ref Rng & Units 08/25/2023    9:08 AM 08/04/2023    9:39 AM 07/29/2023    9:15 AM  CBC  WBC 4.0 - 10.5 K/uL 2.3  2.4  1.8   Hemoglobin 13.0 - 17.0 g/dL  89.3  88.9  88.6   Hematocrit 39.0 - 52.0 % 32.6  34.9  35.5   Platelets 150 - 400 K/uL 190  181  156    Lab Results  Component Value Date   IRON  98 08/25/2023   TIBC 311 08/25/2023   FERRITIN 90 08/25/2023    I personally reviewed and interpreted the available labs, imaging and endoscopic files.  INR 1.1 Alk phos 250 AST 47 Hemoglobin 9.5 AMA very positive 48 Hepatitis A immune Hepatitis B has been nonimmune  Impression and Plan:  HOYTE ZIEBELL is a 48 y.o. male with GERD , alcohol use disorder , newly diagnosed cirrhosis with recent hospitalization for severe anemia hemoglobin 4.9 status post an upper endoscopy and colonoscopy who presents for evaluation of anemia and newly diagnosed cirrhotic, elevated liver enzymes , iron  deficiency anemia (ferritin 13) and painless hematochezia with recent labs also concerning for PBC  Patient complaints today are unintentional weight loss, decreased appetite, nausea and vomiting, fatigue and generalized pruritus  #Cirrhosis  MELD 3.0: 16 at 09/06/2023  8:28 AM MELD-Na: 15 at 09/06/2023  8:28 AM Calculated from: Serum Creatinine: 0.99 mg/dL (Using min of 1 mg/dL) at 88/80/7975  1:71 AM Serum Sodium: 138 mmol/L (Using max of 137 mmol/L) at 09/06/2023  8:28 AM Total Bilirubin: 6.2 mg/dL at 88/80/7975  1:71 AM Serum Albumin: 3.5 g/dL at 88/80/7975  1:71 AM INR(ratio): 1.2 at 09/06/2023  8:28 AM Age at listing (hypothetical): 47 years Sex: Male at 09/06/2023  8:28 AM    #Eitology : Likely ETOH , PBC , r/o AIH overlap   Negative ANA, ASMA AFP<1.8 1.8 IgG 2400 Ceruloplasmin elevated Hep A IgM negative hep B nonexposed hep C negative.  HIV negative AMA very positive 48 Hepatitis A immune Hepatitis B has been nonimmune   # Hepatic encephalopathy None - Avoid opiates or benzodiazepines   # Ascites - Low sodium diet None on imaging   # Esophageal varices - Last EGD 05/2023, no varices. Repeat every 1-2 years   -Although  patient has signs of ortal Hypertension with recent imaging and will start  Carvedilol  3.125 mg BID with goal SBP: and HR: 55-60 bpm for primary ppx  # HCC screening - Last abdominal imaging 05/2023. Last MRI 08/2024 with 1 cm LIRAD 3 lesion  - Last AFP <1.8    Recommendation   -Given MELD >14 with pHTN , will refer patient for liver transplant evaluation  -Liver biopsy to evaluate for PBC-AIH overlap given persistently elevated liver enzymes despite ETOH cessation  - Check CBC, MELD labs - Reduce salt intake to <2 g per day - Can take Tylenol  max of 2 g per day (650 mg q8h) for pain - Avoid NSAIDs for pain -  Avoid eating raw oysters/shellfish -Hep B vaccination (on last dose now)  #Elevated AMA #Primary Biliary Cirrhosis  Patient has constellation of fatigue, generalized pruritus, pathological fracture and lab work with consistently elevated alk phos and significantly elevated AMA with clinical diagnosis of PBC  Recommendation: -Liver tests every 3?6 months -TSH annually -Bone mineral densitometry every 2 years- by PCP -Upper endoscopy every  1?3 years given cirrhotic with possible PBC -Ultrasound with or without alpha fetoprotein every 6 months -Patients with elevated lipid levels and at risk for cardiovascular disease can be considered for lipid?lowering therapy.; follow up with PCP -1,000 to 1,500 mg of calcium and 1,000 International Units of vitamin D  daily in the diet and as supplements if needed. Oral alendronate (70 mg weekly) or other effective bisphosphonates should be considered if patient is osteoporotic. -If  Model for End?Stage Liver Disease score remains elevated than 15, total bilirubin greater than 6 mg/dL,Severe intractable pruritus is an exceptional indication for liver transplantation. Chronic fatigue is not an indication for transplant because this symptom is not universally reversible after liver transplantation. -continue UDCA in a dose of 13 to 15  mg/kg/day orally   #Iron  deficiency anemia- improved with new labs  Most recent labs with HBG 12 and resolving IDA even without supplementation Patient has fatigue and is symptomatic from severe iron  deficiency and also constellation of PBC  But given severity of previous anemia ( HBG 4.0) will complete workup with Capsule endoscopy   #Peptic ulcer diease upper endoscopy peptic ulcer disease and patchy area of hemorrhage status post APC   Will repeat upper endoscopy now as patient is s/p 12 weeks of PPI to assess healing of lesion and ulcers. Will deploy capsule at same time   Avoid all NSAIDs  #Generalized pruritus   This is constellation of PBC. Unable to tolerate hyroxyzine Emoillents and loose clothings Will add on Cholestyramine    #Elevated testosterone  #Unintentional weight loss   HCC remains on differential but the lesion is 1cm and likely not able to be biopsied   HCC and produce androgens   Patient has lost 65 lbs since August 2024 ( 276 lbs -05/2023 ------>211 11/2023)  Repeat AFP Ultrasound scrotum Repeat MRI Abdomen 3 months (March) to assess stability , if any change will refer for multidisciplinary discussion Endocrinology referral for incidental elevated testosterone    #Painless hematochezia- resolved  #Constipation  Patient recently had upper endoscopy and colonoscopy, upper endoscopy peptic ulcer disease and patchy area of hemorrhage status post APC colonoscopy with diverticulosis and hemorrhoids.  Ensure adequate fluid intake: Aim for 8 glasses of water  daily. Take Miralax  twice a day for the first week, then reduce to once daily thereafter. Metamucil twice a day.  RTC 4 weeks  All questions were answered.      Emiko Osorto Faizan Syrus Nakama, MD Gastroenterology and Hepatology [facility] Gastroenterology   This chart has been completed using [facility] Dictation software, and while attempts have been made to ensure accuracy , certain words  and phrases may not be transcribed as intended

## 2023-11-22 NOTE — Progress Notes (Signed)
 Antonio Allen , M.D. Gastroenterology & Hepatology [facility] Gastroenterology [address] [place], [place] [address] Primary Care Physician: Antonio Found, MD [address] [place] [address]  Chief Complaint:  unintentional weight loss, decreased appetite, nausea and vomiting, fatigue and generalized pruritus   cirrhotic, elevated liver enzymes and possible hepatic lesion, iron deficiency anemia, painless hematochezia , PBC  History of Present Illness: Antonio Allen is a 48 y.o. male with GERD , alcohol use disorder (been sober for 5 months -v PETH), compensated cirrhosis now with signs of pHTN , previous hospitalization for severe anemia hemoglobin 4.9 status post an upper endoscopy and colonoscopy , who presents for follow up of anemia , PBC complicated with cirrhosis , elevated liver enzymes,LIRAD 3 hepatic lesion, iron deficiency anemia   Patient had a positive AMA , chronic alk phos elevated and was started on ursodiol with clinical diagnosis of PBC. After starting ursodiol , ALP improved but he now had elevated AST>ALT and T. bili elevation . This has improved in most recent blood work  Denies any medications , alcohol use and fever and chills . Denies any confusion or abdominal pain . MRI Abdomen MRCP( PBC patients do get intrahepatic pigmented stones ) which demonstrated cirrhosis with stigmata of portal hypertension and LIRAD 3 Lesion  Patient main complaint today remains unintentional weight loss, decreased appetite, nausea and vomiting, fatigue and generalized pruritus  Patient have not had alcohol for past 5 months, not even single drink.Previously he was drinking vodka daily for many years.  Previous complaints of hematochezia has resolved   Continues to complain of fatigue. Patient was not able to tolerate hydroxyzine   Last EGD:06/07/2023  Patchy severe inflammation with hemorrhage characterized by adherent blood, congestion (  edema) , erythema and mucus was Allen in the gastric fundus and in the gastric body.  - Normal esophagus.  - Gastritis with hemorrhage.  Treated with argon plasma coagulation (APC).  - gastric ulcer , Scar in the gastric antrum.  Biopsied.  - Duodenitis.   A. GASTRIC, BIOPSY: Antral and oxyntic mucosa with reactive gastropathy. Negative for Helicobacter pylori.  Last Colonoscopy:06/09/2023  - No evidence of old or active bleeding throughout the colon and 10 cm into Ileum - Diverticulosis in the left colon.  - Non- bleeding external and internal hemorrhoids.  - No specimens collected.  Negative ANA, ASMA AFP<1.8 1.8 IgG 2400 Ceruloplasmin elevated  Hep A IgM negative hep B nonexposed hep C negative.  HIV negative Ferritin 13 Hemoglobin 7.5 MCV 82  FHx: neg for any gastrointestinal/liver disease, no malignancies Social: Daily alcohol use.  20 days ago   Past Medical History: Past Medical History:  Diagnosis Date   Alcohol dependence (HCC)    Atypical mole 05/30/2018   moderate atypia on left upper arm   Atypical mole 05/30/2018   mild atypia on mid chest   GERD (gastroesophageal reflux disease)     Past Surgical History: Past Surgical History:  Procedure Laterality Date   BIOPSY  06/07/2023   Procedure: BIOPSY;  Surgeon: Dolores Frame, MD;  [facility]: [facility];  Service: Gastroenterology;;   CERVICAL FUSION     C6   COLONOSCOPY WITH PROPOFOL N/A 06/09/2023   Procedure: COLONOSCOPY WITH PROPOFOL;  Surgeon: Franky Macho, MD;  Location: [facility];  Service: Endoscopy;  Laterality: N/A;   ESOPHAGOGASTRODUODENOSCOPY (EGD) WITH PROPOFOL N/A 06/07/2023   Procedure: ESOPHAGOGASTRODUODENOSCOPY (EGD) WITH PROPOFOL;  Surgeon: Dolores Frame, MD;  Location:  [facility];  Service: Gastroenterology;  Laterality: N/A;   HOT HEMOSTASIS  06/07/2023   Procedure: HOT HEMOSTASIS (ARGON PLASMA COAGULATION/BICAP);  Surgeon: Marguerita Merles, Reuel Boom,  MD;  Location: [facility];  Service: Gastroenterology;;   ORIF ANKLE FRACTURE Left 08/08/2013   Procedure: OPEN REDUCTION INTERNAL FIXATION (ORIF) LEFT ANKLE FRACTURE;  Surgeon: Jacki Cones, MD;  Location: [facility];  Service: Orthopedics;  Laterality: Left;   TONSILLECTOMY      Family History:History reviewed. No pertinent family history.  Social History: Social History   Tobacco Use  Smoking Status Never   Passive exposure: Past  Smokeless Tobacco Never   Social History   Substance and Sexual Activity  Alcohol Use Not Currently   Comment: 1 fifth of liquor daily   Social History   Substance and Sexual Activity  Drug Use No    Allergies: Allergies  Allergen Reactions   Bee Venom Swelling    Medications: Current Outpatient Medications  Medication Sig Dispense Refill   carvedilol (COREG) 3.125 MG tablet Take 1 tablet (3.125 mg total) by mouth 2 (two) times daily with a meal. 180 tablet 0   cholestyramine (QUESTRAN) 4 g packet Take 1 packet (4 g total) by mouth 2 (two) times daily. 60 each 2   LORazepam (ATIVAN) 0.5 MG tablet Take 1 tablet (0.5 mg total) by mouth 2 (two) times daily as needed 60 tablet 0   ondansetron (ZOFRAN) 4 MG tablet Take 1 tablet (4 mg total) by mouth every 8 (eight) hours as needed for nausea or vomiting. 30 tablet 1   pantoprazole (PROTONIX) 40 MG tablet Take 40 mg by mouth daily.     tiZANidine (ZANAFLEX) 4 MG tablet Take 4 mg by mouth every 6 (six) hours.     traMADol (ULTRAM) 50 MG tablet Take 50 mg by mouth every 6 (six) hours.     Vitamin D, Ergocalciferol, (DRISDOL) 1.25 MG (50000 UNIT) CAPS capsule Take 1 capsule (50,000 Units total) by mouth every 7 (seven) days. Then take 2000 units OTC daily 12 capsule 0   escitalopram (LEXAPRO) 10 MG tablet Take 1 tablet (10 mg total) by mouth daily. (Patient not taking: Reported on 11/22/2023) 90 tablet 1   hydrOXYzine (ATARAX) 25 MG tablet Take 25 mg by mouth every 6 (six) hours. (Patient not  taking: Reported on 11/22/2023)     ursodiol (URSO FORTE) 500 MG tablet Take 1 tablet (500 mg total) by mouth 3 (three) times daily. 90 tablet 5   No current facility-administered medications for this visit.    Review of Systems: GENERAL: negative for malaise, night sweats HEENT: No changes in hearing or vision, no nose bleeds or other nasal problems. NECK: Negative for lumps, goiter, pain and significant neck swelling RESPIRATORY: Negative for cough, wheezing CARDIOVASCULAR: Negative for chest pain, leg swelling, palpitations, orthopnea GI: SEE HPI MUSCULOSKELETAL: Negative for joint pain or swelling, back pain, and muscle pain. SKIN: Negative for lesions, rash HEMATOLOGY Negative for prolonged bleeding, bruising easily, and swollen nodes. ENDOCRINE: Negative for cold or heat intolerance, polyuria, polydipsia and goiter. NEURO: negative for tremor, gait imbalance, syncope and seizures. The remainder of the review of systems is noncontributory.   Physical Exam: BP 123/65 (BP Location: Left Arm, Patient Position: Sitting, Cuff Size: Normal)   Pulse 73   Temp 98.4 F (36.9 C) (Temporal)   Ht 6\' 1"  (1.854 m)   Wt 211 lb 14.4 oz (96.1 kg)   BMI 27.96 kg/m  GENERAL: The patient is AO  x3, in no acute distress. HEENT: Head is normocephalic and atraumatic. EOMI are intact. Mouth is well hydrated and without lesions. NECK: Supple. No masses LUNGS: Clear to auscultation. No presence of rhonchi/wheezing/rales. Adequate chest expansion HEART: RRR, normal s1 and s2. ABDOMEN: Soft, nontender, no guarding, no peritoneal signs, and nondistended. BS +. No masses. EXTREMITIES: Without any cyanosis, clubbing, rash, lesions or edema. NEUROLOGIC: AOx3, no focal motor deficit. SKIN: no jaundice, no rashes   Imaging/Labs: as above     Latest Ref Rng & Units 08/25/2023    9:08 AM 08/04/2023    9:39 AM 07/29/2023    9:15 AM  CBC  WBC 4.0 - 10.5 K/uL 2.3  2.4  1.8   Hemoglobin 13.0 - 17.0 g/dL  89.3  88.9  88.6   Hematocrit 39.0 - 52.0 % 32.6  34.9  35.5   Platelets 150 - 400 K/uL 190  181  156    Lab Results  Component Value Date   IRON  98 08/25/2023   TIBC 311 08/25/2023   FERRITIN 90 08/25/2023    I personally reviewed and interpreted the available labs, imaging and endoscopic files.  INR 1.1 Alk phos 250 AST 47 Hemoglobin 9.5 AMA very positive 48 Hepatitis A immune Hepatitis B has been nonimmune  Impression and Plan:  Antonio Allen is a 48 y.o. male with GERD , alcohol use disorder , newly diagnosed cirrhosis with recent hospitalization for severe anemia hemoglobin 4.9 status post an upper endoscopy and colonoscopy who presents for evaluation of anemia and newly diagnosed cirrhotic, elevated liver enzymes , iron  deficiency anemia (ferritin 13) and painless hematochezia with recent labs also concerning for PBC  Patient complaints today are unintentional weight loss, decreased appetite, nausea and vomiting, fatigue and generalized pruritus  #Cirrhosis  MELD 3.0: 16 at 09/06/2023  8:28 AM MELD-Na: 15 at 09/06/2023  8:28 AM Calculated from: Serum Creatinine: 0.99 mg/dL (Using min of 1 mg/dL) at 88/80/7975  1:71 AM Serum Sodium: 138 mmol/L (Using max of 137 mmol/L) at 09/06/2023  8:28 AM Total Bilirubin: 6.2 mg/dL at 88/80/7975  1:71 AM Serum Albumin: 3.5 g/dL at 88/80/7975  1:71 AM INR(ratio): 1.2 at 09/06/2023  8:28 AM Age at listing (hypothetical): 47 years Sex: Male at 09/06/2023  8:28 AM    #Eitology : Likely ETOH , PBC , r/o AIH overlap   Negative ANA, ASMA AFP<1.8 1.8 IgG 2400 Ceruloplasmin elevated Hep A IgM negative hep B nonexposed hep C negative.  HIV negative AMA very positive 48 Hepatitis A immune Hepatitis B has been nonimmune   # Hepatic encephalopathy None - Avoid opiates or benzodiazepines   # Ascites - Low sodium diet None on imaging   # Esophageal varices - Last EGD 05/2023, no varices. Repeat every 1-2 years   -Although  patient has signs of ortal Hypertension with recent imaging and will start  Carvedilol  3.125 mg BID with goal SBP: and HR: 55-60 bpm for primary ppx  # HCC screening - Last abdominal imaging 05/2023. Last MRI 08/2024 with 1 cm LIRAD 3 lesion  - Last AFP <1.8    Recommendation   -Given MELD >14 with pHTN , will refer patient for liver transplant evaluation  -Liver biopsy to evaluate for PBC-AIH overlap given persistently elevated liver enzymes despite ETOH cessation  - Check CBC, MELD labs - Reduce salt intake to <2 g per day - Can take Tylenol  max of 2 g per day (650 mg q8h) for pain - Avoid NSAIDs for pain -  Avoid eating raw oysters/shellfish -Hep B vaccination (on last dose now)  #Elevated AMA #Primary Biliary Cirrhosis  Patient has constellation of fatigue, generalized pruritus, pathological fracture and lab work with consistently elevated alk phos and significantly elevated AMA with clinical diagnosis of PBC  Recommendation: -Liver tests every 3?6 months -TSH annually -Bone mineral densitometry every 2 years- by PCP -Upper endoscopy every  1?3 years given cirrhotic with possible PBC -Ultrasound with or without alpha fetoprotein every 6 months -Patients with elevated lipid levels and at risk for cardiovascular disease can be considered for lipid?lowering therapy.; follow up with PCP -1,000 to 1,500 mg of calcium and 1,000 International Units of vitamin D daily in the diet and as supplements if needed. Oral alendronate (70 mg weekly) or other effective bisphosphonates should be considered if patient is osteoporotic. -If  Model for End?Stage Liver Disease score remains elevated than 15, total bilirubin greater than 6 mg/dL,Severe intractable pruritus is an exceptional indication for liver transplantation. Chronic fatigue is not an indication for transplant because this symptom is not universally reversible after liver transplantation. -continue UDCA in a dose of 13 to 15  mg/kg/day orally   #Iron deficiency anemia- improved with new labs  Most recent labs with HBG 12 and resolving IDA even without supplementation Patient has fatigue and is symptomatic from severe iron deficiency and also constellation of PBC  But given severity of previous anemia ( HBG 4.0) will complete workup with Capsule endoscopy   #Peptic ulcer diease upper endoscopy peptic ulcer disease and patchy area of hemorrhage status post APC   Will repeat upper endoscopy now as patient is s/p 12 weeks of PPI to assess healing of lesion and ulcers. Will deploy capsule at same time   Avoid all NSAIDs  #Generalized pruritus   This is constellation of PBC. Unable to tolerate hyroxyzine Emoillents and loose clothings Will add on Cholestyramine   #Elevated testosterone #Unintentional weight loss   HCC remains on differential but the lesion is 1cm and likely not able to be biopsied   HCC and produce androgens   Patient has lost 65 lbs since August 2024 ( 276 lbs -05/2023 ------>211 11/2023)  Repeat AFP Ultrasound scrotum Repeat MRI Abdomen 3 months (March) to assess stability , if any change will refer for multidisciplinary discussion Endocrinology referral for incidental elevated testosterone   #Painless hematochezia- resolved  #Constipation  Patient recently had upper endoscopy and colonoscopy, upper endoscopy peptic ulcer disease and patchy area of hemorrhage status post APC colonoscopy with diverticulosis and hemorrhoids.  Ensure adequate fluid intake: Aim for 8 glasses of water daily. Take Miralax twice a day for the first week, then reduce to once daily thereafter. Metamucil twice a day.  RTC 4 weeks  All questions were answered.      Antonio Lawman, MD Gastroenterology and Hepatology [facility] Gastroenterology   This chart has been completed using [facility] Dictation software, and while attempts have been made to ensure accuracy , certain words  and phrases may not be transcribed as intended

## 2023-11-23 ENCOUNTER — Other Ambulatory Visit (HOSPITAL_BASED_OUTPATIENT_CLINIC_OR_DEPARTMENT_OTHER): Payer: Self-pay

## 2023-11-23 ENCOUNTER — Other Ambulatory Visit: Payer: Self-pay

## 2023-11-23 ENCOUNTER — Telehealth (INDEPENDENT_AMBULATORY_CARE_PROVIDER_SITE_OTHER): Payer: Self-pay | Admitting: Gastroenterology

## 2023-11-23 ENCOUNTER — Encounter (HOSPITAL_COMMUNITY)
Admission: RE | Admit: 2023-11-23 | Discharge: 2023-11-23 | Disposition: A | Discharge status: Home / Self Care | Payer: 59 | Source: Ambulatory Visit | Attending: Gastroenterology | Admitting: Gastroenterology

## 2023-11-23 NOTE — Addendum Note (Signed)
 Addended by: Timtohy Broski on: 11/23/2023 10:44 AM   Modules accepted: Orders

## 2023-11-23 NOTE — Telephone Encounter (Signed)
 Discussed case with IR regarding possibility of biopsy of the 1cm LIRAD III to be biopsied. They recommend surveillance MRI as this is very tiny to be biopsied specifically

## 2023-11-23 NOTE — Addendum Note (Signed)
 Addended by: Albertus Hughs on: 11/23/2023 08:50 AM   Modules accepted: Orders

## 2023-11-23 NOTE — Telephone Encounter (Signed)
 Pt contacted and EGD with givens scheduled for 11/24/23. Instructions sent via my chart. No pa needed.  Mri and ultrasound scheduled. Appt dates sent via my chart. No pa needed per insurance for MRI.

## 2023-11-23 NOTE — Telephone Encounter (Signed)
 Referral sent, they will contact patient with apt

## 2023-11-23 NOTE — Addendum Note (Signed)
Addended by: [name] on: 11/23/2023 08:47 AM   Modules accepted: Orders

## 2023-11-24 ENCOUNTER — Ambulatory Visit (HOSPITAL_COMMUNITY): Payer: Self-pay | Admitting: Certified Registered Nurse Anesthetist

## 2023-11-24 ENCOUNTER — Encounter (HOSPITAL_COMMUNITY)
Admission: RE | Disposition: A | Discharge status: Home / Self Care | Payer: Self-pay | Source: Home / Self Care | Attending: Gastroenterology

## 2023-11-24 ENCOUNTER — Other Ambulatory Visit (HOSPITAL_BASED_OUTPATIENT_CLINIC_OR_DEPARTMENT_OTHER): Payer: Self-pay

## 2023-11-24 ENCOUNTER — Ambulatory Visit (HOSPITAL_COMMUNITY)
Admission: RE | Admit: 2023-11-24 | Discharge: 2023-11-24 | Disposition: A | Discharge status: Home / Self Care | Payer: 59 | Attending: Gastroenterology | Admitting: Gastroenterology

## 2023-11-24 ENCOUNTER — Encounter (HOSPITAL_COMMUNITY): Payer: Self-pay | Admitting: Gastroenterology

## 2023-11-24 ENCOUNTER — Other Ambulatory Visit: Payer: Self-pay

## 2023-11-24 ENCOUNTER — Ambulatory Visit (HOSPITAL_BASED_OUTPATIENT_CLINIC_OR_DEPARTMENT_OTHER): Payer: 59 | Admitting: Certified Registered Nurse Anesthetist

## 2023-11-24 DIAGNOSIS — K3189 Other diseases of stomach and duodenum: Secondary | ICD-10-CM

## 2023-11-24 DIAGNOSIS — L299 Pruritus, unspecified: Secondary | ICD-10-CM | POA: Insufficient documentation

## 2023-11-24 DIAGNOSIS — I8510 Secondary esophageal varices without bleeding: Secondary | ICD-10-CM | POA: Insufficient documentation

## 2023-11-24 DIAGNOSIS — Z8711 Personal history of peptic ulcer disease: Secondary | ICD-10-CM | POA: Insufficient documentation

## 2023-11-24 DIAGNOSIS — K219 Gastro-esophageal reflux disease without esophagitis: Secondary | ICD-10-CM | POA: Diagnosis not present

## 2023-11-24 DIAGNOSIS — R188 Other ascites: Secondary | ICD-10-CM | POA: Diagnosis not present

## 2023-11-24 DIAGNOSIS — K766 Portal hypertension: Secondary | ICD-10-CM | POA: Diagnosis not present

## 2023-11-24 DIAGNOSIS — K7682 Hepatic encephalopathy: Secondary | ICD-10-CM | POA: Insufficient documentation

## 2023-11-24 DIAGNOSIS — K648 Other hemorrhoids: Secondary | ICD-10-CM | POA: Diagnosis not present

## 2023-11-24 DIAGNOSIS — D500 Iron deficiency anemia secondary to blood loss (chronic): Secondary | ICD-10-CM | POA: Insufficient documentation

## 2023-11-24 DIAGNOSIS — K7460 Unspecified cirrhosis of liver: Secondary | ICD-10-CM | POA: Insufficient documentation

## 2023-11-24 DIAGNOSIS — R634 Abnormal weight loss: Secondary | ICD-10-CM | POA: Diagnosis not present

## 2023-11-24 DIAGNOSIS — Z6827 Body mass index (BMI) 27.0-27.9, adult: Secondary | ICD-10-CM | POA: Diagnosis not present

## 2023-11-24 DIAGNOSIS — D509 Iron deficiency anemia, unspecified: Secondary | ICD-10-CM | POA: Diagnosis not present

## 2023-11-24 HISTORY — PX: ESOPHAGOGASTRODUODENOSCOPY: SHX5428

## 2023-11-24 HISTORY — PX: GIVENS CAPSULE STUDY: SHX5432

## 2023-11-24 SURGERY — EGD (ESOPHAGOGASTRODUODENOSCOPY)
Anesthesia: General

## 2023-11-24 MED ORDER — SODIUM CHLORIDE 0.9% FLUSH: 3.0000 mL | Freq: Two times a day (BID) | INTRAVENOUS | Status: DC

## 2023-11-24 MED ORDER — STERILE WATER FOR IRRIGATION IR SOLN
Status: DC | PRN
  Administered 2023-11-24: 60 mL

## 2023-11-24 MED ORDER — LIDOCAINE HCL (PF) 2 % IJ SOLN
INTRAMUSCULAR | Status: AC
  Filled 2023-11-24: qty 5

## 2023-11-24 MED ORDER — SODIUM CHLORIDE 0.9% FLUSH
3.0000 mL | Freq: Two times a day (BID) | INTRAVENOUS | Status: DC
  Administered 2023-11-24: 10 mL via INTRAVENOUS

## 2023-11-24 MED ORDER — PANTOPRAZOLE SODIUM 40 MG PO TBEC
40.0000 mg | DELAYED_RELEASE_TABLET | Freq: Every day | ORAL | 2 refills | Status: DC
  Filled 2023-11-24: qty 30, 30d supply, fill #0
  Filled 2023-12-25: qty 30, 30d supply, fill #1
  Filled 2024-02-02: qty 30, 30d supply, fill #2

## 2023-11-24 MED ORDER — LACTATED RINGERS IV SOLN: INTRAVENOUS | Status: DC | PRN

## 2023-11-24 MED ORDER — PROPOFOL 10 MG/ML IV BOLUS
INTRAVENOUS | Status: DC | PRN
  Administered 2023-11-24: 100 mg via INTRAVENOUS
  Administered 2023-11-24: 225 ug/kg/min via INTRAVENOUS

## 2023-11-24 MED ORDER — SODIUM CHLORIDE 0.9% FLUSH: 3.0000 mL | INTRAVENOUS | Status: DC | PRN

## 2023-11-24 NOTE — Progress Notes (Signed)
 Notified Dr.Ewell that patient drunk 8 oz water  total, had sips from 0515-8:30 this morning. Last sip of water  at 0830 am. He states that's fine. No new orders given. [facility] RN notified also.

## 2023-11-24 NOTE — Op Note (Signed)
 [facility] Patient Name: Antonio Allen Procedure Date: 11/24/2023 9:31 AM MRN: [id number] Date of Birth: November 22, 1975 Attending MD: Deatrice Dine , MD, [id number] CSN: [id number] Age: 48 Admit Type: Outpatient Procedure:                Upper GI endoscopy Indications:              Iron  deficiency anemia secondary to chronic blood                            loss, Weight loss Providers:                Deatrice Dine, MD, Jon LABOR. Gerome RN, RN,                            Crystal Page, Daphne Mulch Technician, Pensions Consultant Referring MD:              Medicines:                Monitored Anesthesia Care Complications:            No immediate complications. Estimated Blood Loss:     Estimated blood loss was minimal. Procedure:                Pre-Anesthesia Assessment:                           - Prior to the procedure, a History and Physical                            was performed, and patient medications and                            allergies were reviewed. The patient's tolerance of                            previous anesthesia was also reviewed. The risks                            and benefits of the procedure and the sedation                            options and risks were discussed with the patient.                            All questions were answered, and informed consent                            was obtained. Prior Anticoagulants: The patient has                            taken no anticoagulant or antiplatelet agents. ASA                            Grade Assessment: III - A patient with severe  systemic disease. After reviewing the risks and                            benefits, the patient was deemed in satisfactory                            condition to undergo the procedure.                           After obtaining informed consent, the endoscope was                            passed under direct vision. Throughout the                             procedure, the patient's blood pressure, pulse, and                            oxygen saturations were monitored continuously. The                            [id number]-[id number] ([id number]) scope was introduced through the                            mouth, and advanced to the second part of duodenum.                            The upper GI endoscopy was accomplished without                            difficulty. The patient tolerated the procedure                            well. Scope In: 9:45:10 AM Scope Out: 9:54:58 AM Total Procedure Duration: 0 hours 9 minutes 48 seconds  Findings:      There is no endoscopic evidence of varices in the entire esophagus.      Moderate portal hypertensive gastropathy was found in the entire       examined stomach.      The duodenal bulb and second portion of the duodenum were normal. Using       the endoscope, the video capsule enteroscope was advanced into the       duodenal bulb. Impression:               - Portal hypertensive gastropathy.                           - Normal duodenal bulb and second portion of the                            duodenum.                           - Successful completion of the Video Capsule  Enteroscope placement.                           - No specimens collected.                           -Note previously seen bleeding area and ulcer have                            healed but this exam appears to be worse than                            previous one in terms of progression of portal                            hypertensive gastropathy                           -IDA can be explained by PHG but given profound                            weight loss and initially with profound exam video                            capsule was deployed to examine small bowel                           -Also note that while deploying the capsule there                            was contact and trauma to the antrum and small                             amount of self-limiting oozing hence blood in first                            part of duodenum may be seen in video capsule Moderate Sedation:      Per Anesthesia Care Recommendation:           - Patient has a contact number available for                            emergencies. The signs and symptoms of potential                            delayed complications were discussed with the                            patient. Return to normal activities tomorrow.                            Written discharge instructions were provided to the  patient.                           - Resume previous diet.                           - Continue present medications.                           -Beta blockers were started and uptitrate                           -PO iron  q48 hours                           -PPI daily                           -Continue to follow up in GI clinic and patient was                            referred for transplant evaluation. Procedure Code(s):        --- Professional ---                           [phone], Esophagogastroduodenoscopy, flexible,                            transoral; diagnostic, including collection of                            specimen(s) by brushing or washing, when performed                            (separate procedure) Diagnosis Code(s):        --- Professional ---                           K76.6, Portal hypertension                           K31.89, Other diseases of stomach and duodenum                           D50.0, Iron  deficiency anemia secondary to blood                            loss (chronic)                           R63.4, Abnormal weight loss CPT copyright 2022 American Medical Association. All rights reserved. The codes documented in this report are preliminary and upon coder review may  be revised to meet current compliance requirements. Deatrice Dine, MD Deatrice Dine, MD 11/24/2023  10:05:10 AM This report has been signed electronically. Number of Addenda: 0

## 2023-11-24 NOTE — Anesthesia Preprocedure Evaluation (Signed)
 Anesthesia Evaluation  Patient identified by MRN, date of birth, ID band Patient awake    Reviewed: Allergy & Precautions, H&P , NPO status , Patient's Chart, lab work & pertinent test results, reviewed documented beta blocker date and time   Airway Mallampati: II  TM Distance: >3 FB Neck ROM: full    Dental no notable dental hx. (+) Dental Advisory Given, Teeth Intact   Pulmonary neg pulmonary ROS, Patient abstained from smoking.   Pulmonary exam normal breath sounds clear to auscultation       Cardiovascular Exercise Tolerance: Good negative cardio ROS  Rhythm:regular Rate:Normal     Neuro/Psych negative neurological ROS  negative psych ROS   GI/Hepatic PUD,GERD  ,,(+) Cirrhosis     substance abuse  alcohol use  Endo/Other  negative endocrine ROS    Renal/GU negative Renal ROS  negative genitourinary   Musculoskeletal   Abdominal   Peds  Hematology   Anesthesia Other Findings   Reproductive/Obstetrics negative OB ROS                             Anesthesia Physical Anesthesia Plan  ASA: 3  Anesthesia Plan: General   Post-op Pain Management: Minimal or no pain anticipated   Induction:   PONV Risk Score and Plan: Propofol  infusion  Airway Management Planned: Nasal Cannula and Natural Airway  Additional Equipment: None  Intra-op Plan:   Post-operative Plan:   Informed Consent: I have reviewed the patients History and Physical, chart, labs and discussed the procedure including the risks, benefits and alternatives for the proposed anesthesia with the patient or authorized representative who has indicated his/her understanding and acceptance.     Dental Advisory Given  Plan Discussed with: CRNA  Anesthesia Plan Comments:        Anesthesia Quick Evaluation

## 2023-11-24 NOTE — Transfer of Care (Signed)
 Immediate Anesthesia Transfer of Care Note  Patient: Antonio Allen  Procedure(s) Performed: ESOPHAGOGASTRODUODENOSCOPY (EGD) GIVENS CAPSULE STUDY  Patient Location: Short Stay  Anesthesia Type:General  Level of Consciousness: awake, alert , and oriented  Airway & Oxygen Therapy: Patient Spontanous Breathing  Post-op Assessment: Report given to RN, Post -op Vital signs reviewed and stable, Patient moving all extremities X 4, and Patient able to stick tongue midline  Post vital signs: Reviewed and stable  Last Vitals:  Vitals Value Taken Time  BP 127/71 11/24/23 1001  Temp 36.5 C 11/24/23 1001  Pulse 74 11/24/23 1001  Resp 15 11/24/23 1001  SpO2 100 % 11/24/23 1001    Last Pain:  Vitals:   11/24/23 1001  TempSrc: Oral  PainSc: 0-No pain         Complications: No notable events documented.

## 2023-11-24 NOTE — Interval H&P Note (Signed)
 History and Physical Interval Note:  11/24/2023 9:09 AM  Antonio Allen  has presented today for surgery, with the diagnosis of PEPTIC ULCER DISEASE, IRON  DEFICIENCY ANEMIA.  The various methods of treatment have been discussed with the patient and family. After consideration of risks, benefits and other options for treatment, the patient has consented to  Procedure(s) with comments: ESOPHAGOGASTRODUODENOSCOPY (EGD) (N/A) - 10:30AM;ASA 1-2 GIVENS CAPSULE STUDY (N/A) - 10:30AM;ASA 1-2 as a surgical intervention.  The patient's history has been reviewed, patient examined, no change in status, stable for surgery.  I have reviewed the patient's chart and labs.  Questions were answered to the patient's satisfaction.     [name]

## 2023-11-24 NOTE — Anesthesia Postprocedure Evaluation (Signed)
 Anesthesia Post Note  Patient: Antonio Allen  Procedure(s) Performed: ESOPHAGOGASTRODUODENOSCOPY (EGD) GIVENS CAPSULE STUDY  Patient location during evaluation: PACU Anesthesia Type: General Level of consciousness: awake and alert Pain management: pain level controlled Vital Signs Assessment: post-procedure vital signs reviewed and stable Respiratory status: spontaneous breathing, nonlabored ventilation, respiratory function stable and patient connected to nasal cannula oxygen Cardiovascular status: blood pressure returned to baseline and stable Postop Assessment: no apparent nausea or vomiting Anesthetic complications: no   There were no known notable events for this encounter.   Last Vitals:  Vitals:   11/24/23 0922 11/24/23 1001  BP: 138/75 127/71  Pulse: 66 74  Resp: 16 15  Temp: 36.9 C 36.5 C  SpO2: 100% 100%    Last Pain:  Vitals:   11/24/23 1001  TempSrc: Oral  PainSc: 0-No pain                 Ladeidra Borys L Makiyla Linch

## 2023-11-25 ENCOUNTER — Inpatient Hospital Stay: Payer: 59

## 2023-11-25 ENCOUNTER — Ambulatory Visit (HOSPITAL_COMMUNITY)
Admission: RE | Admit: 2023-11-25 | Discharge: 2023-11-25 | Disposition: A | Discharge status: Home / Self Care | Payer: 59 | Source: Ambulatory Visit | Attending: Gastroenterology | Admitting: Gastroenterology

## 2023-11-25 ENCOUNTER — Telehealth (INDEPENDENT_AMBULATORY_CARE_PROVIDER_SITE_OTHER): Payer: Self-pay | Admitting: *Deleted

## 2023-11-25 ENCOUNTER — Other Ambulatory Visit: Payer: BC Managed Care – PPO

## 2023-11-25 ENCOUNTER — Encounter (HOSPITAL_COMMUNITY): Payer: Self-pay | Admitting: Gastroenterology

## 2023-11-25 DIAGNOSIS — R7989 Other specified abnormal findings of blood chemistry: Secondary | ICD-10-CM | POA: Insufficient documentation

## 2023-11-25 NOTE — Telephone Encounter (Signed)
 Pt called and states he had lab orders that had a note on it to do around 12/20/23 but when he saw you at visit this week he wasn't sure if he needed to do labs now. He said there was a question about whether or not his testosterone  was a lab error. He had last testosterone  done. A little of two weeks ago.   [phone]

## 2023-11-28 ENCOUNTER — Encounter (INDEPENDENT_AMBULATORY_CARE_PROVIDER_SITE_OTHER): Payer: Self-pay | Admitting: *Deleted

## 2023-11-28 ENCOUNTER — Encounter: Payer: Self-pay | Admitting: Family Medicine

## 2023-11-29 DIAGNOSIS — K766 Portal hypertension: Secondary | ICD-10-CM | POA: Diagnosis not present

## 2023-11-29 DIAGNOSIS — K3189 Other diseases of stomach and duodenum: Secondary | ICD-10-CM | POA: Diagnosis not present

## 2023-11-29 DIAGNOSIS — D509 Iron deficiency anemia, unspecified: Secondary | ICD-10-CM | POA: Diagnosis not present

## 2023-11-29 NOTE — Procedures (Signed)
 Small Bowel Givens Capsule Study Procedure date: 11/24/2023  PCP:  Dr. Marvine Rush, MD  Indication for procedure:  Iron  deficiency anemia , weight loss  Findings:  Study was adequate as capsule reached the cecum and the bowel preparation was adequate in the small bowel.  There was self-limiting bleeding seen in the duodenal bulb which was also seen during endoscopy which was due to scope trauma while deploying the capsule and from friable portal hypertensive gastropathy . No masses or active bleeding seen otherwise       Summary & Recommendations:  IDA can be explained by PHG (Portal hypertensive gastropathy)  as even while deploying capsule there was some self limiting oozing encountered during EGD and capsule. Most recent blood work which significant improvement in hemoglobin   - Beta blocker was  started and uptitrate  - PO iron  q48 hours  - PPI daily -repeat iron  profile and CBC in few months   I attempted to communicated these recommendations to the patient, but patient did not pick up . A HIPAA complaint VM was left   Antonio Allen Antonio Allen , MD Gastroenterology and Hepatology [facility]

## 2023-12-01 ENCOUNTER — Other Ambulatory Visit (HOSPITAL_BASED_OUTPATIENT_CLINIC_OR_DEPARTMENT_OTHER): Payer: Self-pay

## 2023-12-01 MED ORDER — TIZANIDINE HCL 4 MG PO TABS
4.0000 mg | ORAL_TABLET | Freq: Four times a day (QID) | ORAL | 4 refills | Status: DC
  Filled 2023-12-01: qty 56, 14d supply, fill #0
  Filled 2023-12-25: qty 56, 14d supply, fill #1

## 2023-12-02 ENCOUNTER — Ambulatory Visit: Payer: BC Managed Care – PPO | Admitting: Oncology

## 2023-12-02 ENCOUNTER — Inpatient Hospital Stay: Payer: 59 | Attending: Oncology | Admitting: Oncology

## 2023-12-02 VITALS — BP 121/72 | HR 55 | Temp 97.9°F | Resp 16 | Wt 222.7 lb

## 2023-12-02 DIAGNOSIS — Z79899 Other long term (current) drug therapy: Secondary | ICD-10-CM | POA: Insufficient documentation

## 2023-12-02 DIAGNOSIS — K7030 Alcoholic cirrhosis of liver without ascites: Secondary | ICD-10-CM | POA: Diagnosis not present

## 2023-12-02 DIAGNOSIS — D72819 Decreased white blood cell count, unspecified: Secondary | ICD-10-CM | POA: Insufficient documentation

## 2023-12-02 DIAGNOSIS — R5383 Other fatigue: Secondary | ICD-10-CM | POA: Insufficient documentation

## 2023-12-02 DIAGNOSIS — Z91030 Bee allergy status: Secondary | ICD-10-CM | POA: Diagnosis not present

## 2023-12-02 DIAGNOSIS — K743 Primary biliary cirrhosis: Secondary | ICD-10-CM | POA: Insufficient documentation

## 2023-12-02 DIAGNOSIS — D500 Iron deficiency anemia secondary to blood loss (chronic): Secondary | ICD-10-CM | POA: Diagnosis not present

## 2023-12-02 DIAGNOSIS — D708 Other neutropenia: Secondary | ICD-10-CM

## 2023-12-02 DIAGNOSIS — K279 Peptic ulcer, site unspecified, unspecified as acute or chronic, without hemorrhage or perforation: Secondary | ICD-10-CM | POA: Diagnosis not present

## 2023-12-02 DIAGNOSIS — K274 Chronic or unspecified peptic ulcer, site unspecified, with hemorrhage: Secondary | ICD-10-CM | POA: Diagnosis not present

## 2023-12-02 DIAGNOSIS — K769 Liver disease, unspecified: Secondary | ICD-10-CM

## 2023-12-05 ENCOUNTER — Encounter: Payer: Self-pay | Admitting: Oncology

## 2023-12-05 DIAGNOSIS — K769 Liver disease, unspecified: Secondary | ICD-10-CM | POA: Insufficient documentation

## 2023-12-05 NOTE — Assessment & Plan Note (Signed)
Recent MRI of liver showed LI-RADS 3 liver lesion. -Being worked up by GI and is planned to get a biopsy done soon.

## 2023-12-05 NOTE — Progress Notes (Signed)
[facility] HEMATOLOGY FOLLOW-UP VISIT  [name], MD  REASON FOR FOLLOW-UP: Iron deficiency anemia  ASSESSMENT & PLAN:  Patient is a 48 year old male with past medical history of alcohol use, primary biliary cirrhosis and peptic ulcer disease following for iron deficiency anemia   Iron deficiency anemia due to chronic blood loss Anemia significantly improved at this time.  Likely secondary to GI bleed.  Hemoglobin from recent labs is 12.3.  Recent upper GI endoscopy showed healed previously bleeding areas. -Continue taking iron pills every other day -Continue to monitor at this time  Return to clinic in 6 months with labs.  Leukopenia Leukopenia likely secondary to cirrhosis.  Stable at this time with a WBC of 2.6. -Improved from prior -Continue to monitor -Advised patient to monitor for fevers and go to the ER if it is more than 100.50F  Peptic ulcer disease No current symptoms reported with pantoprazole. -Continue pantoprazole as prescribed.   Cirrhosis, alcoholic (HCC) No alcohol consumption reported in the past 5 months. No withdrawal symptoms. -Continue alcohol abstinence. -Continue to follow with GI and take beta-blocker for varices.  Liver lesion Recent MRI of liver showed LI-RADS 3 liver lesion. -Being worked up by GI and is planned to get a biopsy done soon.    Orders Placed This Encounter  Procedures   Ferritin    Standing Status:   Future    Expected Date:   05/28/2024    Expiration Date:   12/01/2024   Comprehensive metabolic panel    Standing Status:   Future    Expected Date:   05/28/2024    Expiration Date:   12/01/2024   CBC with Differential/Platelet    Standing Status:   Future    Expected Date:   05/28/2024    Expiration Date:   12/01/2024   Iron and TIBC    Standing Status:   Future    Expected Date:   05/28/2024    Expiration Date:   12/01/2024    The total time spent in the appointment was 20 minutes  encounter with patients including review of chart and various tests results, discussions about plan of care and coordination of care plan   All questions were answered. The patient knows to call the clinic with any problems, questions or concerns. No barriers to learning was detected.  Cindie Crumbly, MD 2/17/20251:48 PM   SUMMARY OF HEMATOLOGIC HISTORY: Patient has anemia secondary to iron deficiency, chronic blood loss due to PUD and a competent of chronic disease secondary to cirrhosis -S/p IV Venofer 400 mg X 4 doses   INTERVAL HISTORY: Antonio Allen 48 y.o. male with past medical history of alcohol use, primary biliary cirrhosis, PUD iron deficiency following up for iron deficiency anemia.  He is accompanied by his wife today.  He does not report any more melena, blood in stools.  He continues to lose weight and feels fatigued but it did improved from prior.    We did cussed the MRI results showing a liver lesion and is being worked up by GI at this time with the planned biopsy coming soon.  He has no other complaints today and has been doing well overall.  I have reviewed the past medical history, past surgical history, social history and family history with the patient   ALLERGIES:  is allergic to bee venom.  MEDICATIONS:  Current Outpatient Medications  Medication Sig Dispense Refill   carvedilol (COREG) 3.125 MG tablet Take 1  tablet (3.125 mg total) by mouth 2 (two) times daily with a meal. 180 tablet 0   cholestyramine (QUESTRAN) 4 g packet Take 1 packet (4 g total) by mouth 2 (two) times daily. 60 each 2   hydrOXYzine (ATARAX) 25 MG tablet Take 25 mg by mouth every 6 (six) hours.     LORazepam (ATIVAN) 0.5 MG tablet Take 1 tablet (0.5 mg total) by mouth 2 (two) times daily as needed 60 tablet 0   ondansetron (ZOFRAN) 4 MG tablet Take 1 tablet (4 mg total) by mouth every 8 (eight) hours as needed for nausea or vomiting. 30 tablet 1   pantoprazole (PROTONIX) 40 MG tablet Take  1 tablet (40 mg total) by mouth daily. 30 tablet 2   tiZANidine (ZANAFLEX) 4 MG tablet Take 4 mg by mouth every 6 (six) hours.     tiZANidine (ZANAFLEX) 4 MG tablet Take 1 tablet (4 mg total) by mouth every 6 (six) hours. 56 tablet 4   traMADol (ULTRAM) 50 MG tablet Take 50 mg by mouth every 6 (six) hours.     ursodiol (URSO FORTE) 500 MG tablet Take 1 tablet (500 mg total) by mouth 3 (three) times daily. 90 tablet 5   Vitamin D, Ergocalciferol, (DRISDOL) 1.25 MG (50000 UNIT) CAPS capsule Take 1 capsule (50,000 Units total) by mouth every 7 (seven) days. Then take 2000 units OTC daily 12 capsule 0   escitalopram (LEXAPRO) 10 MG tablet Take 1 tablet (10 mg total) by mouth daily. (Patient not taking: Reported on 12/02/2023) 90 tablet 1   No current facility-administered medications for this visit.     REVIEW OF SYSTEMS:   Constitutional: Denies fevers, chills or night sweats Eyes: Denies blurriness of vision Ears, nose, mouth, throat, and face: Denies mucositis or sore throat Respiratory: Denies cough, dyspnea or wheezes Cardiovascular: Denies palpitation, chest discomfort or lower extremity swelling Gastrointestinal:  Denies nausea, heartburn or change in bowel habits Skin: Denies abnormal skin rashes Lymphatics: Denies new lymphadenopathy or easy bruising Neurological:Denies numbness, tingling or new weaknesses Behavioral/Psych: Mood is stable, no new changes  All other systems were reviewed with the patient and are negative.  PHYSICAL EXAMINATION:   Vitals:   12/02/23 0806  BP: 121/72  Pulse: (!) 55  Resp: 16  Temp: 97.9 F (36.6 C)  SpO2: 100%    GENERAL:alert, no distress and comfortable LUNGS: clear to auscultation and percussion with normal breathing effort HEART: regular rate & rhythm and no murmurs and no lower extremity edema ABDOMEN:abdomen soft, non-tender and normal bowel sounds Musculoskeletal:no cyanosis of digits and no clubbing  NEURO: alert & oriented x 3 with  fluent speech  LABORATORY DATA:  I have reviewed the data as listed labs from primary care from 11/09/2023  CBC: WBC: 2.6, hemoglobin: 12.3, hematocrit: 35.1, MCV: 91, platelets: 184, ANC: 1200  CMP: Total bilirubin: 2.9 Alkaline phosphatase: 251 Rest within normal limits  Iron panel: Ferritin: 273, TIBC: 254, iron: 79, TSAT: 31 Vitamin B12: 1164 Folate: 4.5  Lab Results  Component Value Date   WBC 2.3 (L) 08/25/2023   NEUTROABS 1.1 (L) 08/25/2023   HGB 10.6 (L) 08/25/2023   HCT 32.6 (L) 08/25/2023   MCV 87.9 08/25/2023   PLT 190 08/25/2023       Chemistry      Component Value Date/Time   NA 138 09/06/2023 0828   K 3.3 (L) 09/06/2023 0828   CL 101 09/06/2023 0828   CO2 22 09/06/2023 0828   BUN 6 09/06/2023  6644   CREATININE 0.99 09/06/2023 0828   CREATININE 0.90 06/21/2023 1000      Component Value Date/Time   CALCIUM 9.3 09/06/2023 0828   ALKPHOS 184 (H) 09/06/2023 0828   AST 60 (H) 09/06/2023 0828   ALT 42 09/06/2023 0828   BILITOT 6.2 (H) 09/06/2023 0347

## 2023-12-05 NOTE — Assessment & Plan Note (Signed)
No alcohol consumption reported in the past 5 months. No withdrawal symptoms. -Continue alcohol abstinence. -Continue to follow with GI and take beta-blocker for varices.

## 2023-12-05 NOTE — Assessment & Plan Note (Signed)
Anemia significantly improved at this time.  Likely secondary to GI bleed.  Hemoglobin from recent labs is 12.3.  Recent upper GI endoscopy showed healed previously bleeding areas. -Continue taking iron pills every other day -Continue to monitor at this time  Return to clinic in 6 months with labs.

## 2023-12-05 NOTE — Assessment & Plan Note (Signed)
Leukopenia likely secondary to cirrhosis.  Stable at this time with a WBC of 2.6. -Improved from prior -Continue to monitor -Advised patient to monitor for fevers and go to the ER if it is more than 100.79F

## 2023-12-05 NOTE — Assessment & Plan Note (Signed)
No current symptoms reported with pantoprazole. -Continue pantoprazole as prescribed.

## 2023-12-07 ENCOUNTER — Other Ambulatory Visit: Payer: Self-pay | Admitting: Student

## 2023-12-07 DIAGNOSIS — K7030 Alcoholic cirrhosis of liver without ascites: Secondary | ICD-10-CM

## 2023-12-07 NOTE — Progress Notes (Signed)
Chief Complaint: Patient was seen in consultation today for primary biliary cholangitis with consideration for random liver biopsy.  Referring Provider(s): Dr. Chelsea Aus, MD   Supervising Physician: Marliss Coots  Patient Status: [facility] - Out-pt  Patient is Full Code  History of Present Illness: Antonio Allen is a 48 y.o. male with PMHx notable for EtOH cirrhosis.  Per Dr. Larita Fife H&P note on 11/22/23: Antonio Allen is a 48 y.o. male with GERD , alcohol use disorder , newly diagnosed cirrhosis with recent hospitalization for severe anemia hemoglobin 4.9 status post an upper endoscopy and colonoscopy who presents for evaluation of anemia and newly diagnosed cirrhotic, elevated liver enzymes , iron deficiency anemia (ferritin 13) and painless hematochezia with recent labs also concerning for PBC   Recommendation   -Given MELD >14 with pHTN , will refer patient for liver transplant evaluation  -Liver biopsy to evaluate for PBC-AIH overlap given persistently elevated liver enzymes despite ETOH cessation  - Check CBC, MELD labs - Reduce salt intake to <2 g per day - Can take Tylenol max of 2 g per day (650 mg q8h) for pain - Avoid NSAIDs for pain - Avoid eating raw oysters/shellfish -Hep B vaccination (on last dose now)  Interventional Radiology was requested for random liver biopsy. Patient is scheduled for same in IR today.  All labs and medications are within acceptable parameters. No pertinent allergies. Patient has been NPO since midnight.    Currently without any significant complaints. Patient alert and laying in bed,calm. Denies any fevers, headache, chest pain, SOB, cough, abdominal pain, nausea, vomiting or bleeding.       Past Medical History:  Diagnosis Date   Alcohol dependence (HCC)    Atypical mole 05/30/2018   moderate atypia on left upper arm   Atypical mole 05/30/2018   mild atypia on mid chest   GERD (gastroesophageal reflux disease)     Past  Surgical History:  Procedure Laterality Date   BIOPSY  06/07/2023   Procedure: BIOPSY;  Surgeon: Dolores Frame, MD;  Location: [facility];  Service: Gastroenterology;;   CERVICAL FUSION     C6   COLONOSCOPY WITH PROPOFOL N/A 06/09/2023   Procedure: COLONOSCOPY WITH PROPOFOL;  Surgeon: Franky Macho, MD;  Location: [facility];  Service: Endoscopy;  Laterality: N/A;   ESOPHAGOGASTRODUODENOSCOPY N/A 11/24/2023   Procedure: ESOPHAGOGASTRODUODENOSCOPY (EGD);  Surgeon: Franky Macho, MD;  Location: [facility];  Service: Endoscopy;  Laterality: N/A;  10:30AM;ASA 1-2   ESOPHAGOGASTRODUODENOSCOPY (EGD) WITH PROPOFOL N/A 06/07/2023   Procedure: ESOPHAGOGASTRODUODENOSCOPY (EGD) WITH PROPOFOL;  Surgeon: Dolores Frame, MD;  Location: [facility];  Service: Gastroenterology;  Laterality: N/A;   GIVENS CAPSULE STUDY N/A 11/24/2023   Procedure: GIVENS CAPSULE STUDY;  Surgeon: Franky Macho, MD;  Location: [facility];  Service: Endoscopy;  Laterality: N/A;  10:30AM;ASA 1-2   HOT HEMOSTASIS  06/07/2023   Procedure: HOT HEMOSTASIS (ARGON PLASMA COAGULATION/BICAP);  Surgeon: Marguerita Merles, Reuel Boom, MD;  Location: [facility];  Service: Gastroenterology;;   ORIF ANKLE FRACTURE Left 08/08/2013   Procedure: OPEN REDUCTION INTERNAL FIXATION (ORIF) LEFT ANKLE FRACTURE;  Surgeon: Jacki Cones, MD;  Location: [facility];  Service: Orthopedics;  Laterality: Left;   TONSILLECTOMY      Allergies: Bee venom  Medications: Prior to Admission medications   Medication Sig Start Date End Date Taking? Authorizing Provider  carvedilol (COREG) 3.125 MG tablet Take 1 tablet (3.125 mg total) by mouth  2 (two) times daily with a meal. 11/22/23 02/21/24  Ahmed, [name], MD  cholestyramine (QUESTRAN) 4 g packet Take 1 packet (4 g total) by mouth 2 (two) times daily. 11/22/23   Ahmed, [name], MD  escitalopram (LEXAPRO) 10 MG tablet Take 1 tablet (10 mg total) by mouth daily. Patient  not taking: Reported on 12/02/2023 11/09/23     hydrOXYzine (ATARAX) 25 MG tablet Take 25 mg by mouth every 6 (six) hours. 06/21/23   [provider]  LORazepam (ATIVAN) 0.5 MG tablet Take 1 tablet (0.5 mg total) by mouth 2 (two) times daily as needed 11/09/23     ondansetron (ZOFRAN) 4 MG tablet Take 1 tablet (4 mg total) by mouth every 8 (eight) hours as needed for nausea or vomiting. 08/30/23   Ahmed, [name], MD  pantoprazole (PROTONIX) 40 MG tablet Take 1 tablet (40 mg total) by mouth daily. 11/24/23 02/22/24  Franky Macho, MD  tiZANidine (ZANAFLEX) 4 MG tablet Take 4 mg by mouth every 6 (six) hours. 06/21/23   [provider]  tiZANidine (ZANAFLEX) 4 MG tablet Take 1 tablet (4 mg total) by mouth every 6 (six) hours. 12/01/23     traMADol (ULTRAM) 50 MG tablet Take 50 mg by mouth every 6 (six) hours. 06/25/23   [provider]  ursodiol (URSO FORTE) 500 MG tablet Take 1 tablet (500 mg total) by mouth 3 (three) times daily. 11/22/23 05/20/24  Franky Macho, MD  Vitamin D, Ergocalciferol, (DRISDOL) 1.25 MG (50000 UNIT) CAPS capsule Take 1 capsule (50,000 Units total) by mouth every 7 (seven) days. Then take 2000 units OTC daily 11/10/23        No family history on file.  Social History   Socioeconomic History   Marital status: Married    Spouse name: Not on file   Number of children: Not on file   Years of education: Not on file   Highest education level: Not on file  Occupational History   Not on file  Tobacco Use   Smoking status: Never    Passive exposure: Past   Smokeless tobacco: Never  Vaping Use   Vaping status: Never Used  Substance and Sexual Activity   Alcohol use: Not Currently    Comment: 1 fifth of liquor daily   Drug use: No   Sexual activity: Yes  Other Topics Concern   Not on file  Social History Narrative   Not on file   Social Drivers of Health   Financial Resource Strain: Not on file  Food Insecurity: No Food Insecurity (06/06/2023)    Hunger Vital Sign    Worried About Running Out of Food in the Last Year: Never true    Ran Out of Food in the Last Year: Never true  Transportation Needs: No Transportation Needs (06/06/2023)   PRAPARE - Administrator, Civil Service (Medical): No    Lack of Transportation (Non-Medical): No  Physical Activity: Not on file  Stress: Not on file  Social Connections: Not on file     Review of Systems: A 12 point ROS discussed and pertinent positives are indicated in the HPI above.  All other systems are negative.   Vital Signs: BP 127/71   Pulse 65   Temp 99.3 F (37.4 C) (Oral)   Resp 17   Ht 6\' 1"  (1.854 m)   Wt 220 lb (99.8 kg)   SpO2 98%   BMI 29.03 kg/m   Advance Care Plan:  The advanced care place/surrogate decision maker was discussed at the time of visit and the patient did not wish to discuss or was not able to name a surrogate decision maker or provide an advance care plan.  Physical Exam Vitals reviewed.  Constitutional:      General: He is not in acute distress.    Appearance: Normal appearance.  HENT:     Mouth/Throat:     Mouth: Mucous membranes are moist.  Cardiovascular:     Rate and Rhythm: Normal rate and regular rhythm.     Pulses: Normal pulses.     Heart sounds: No murmur heard. Pulmonary:     Effort: Pulmonary effort is normal. No respiratory distress.     Breath sounds: Normal breath sounds.  Abdominal:     General: Abdomen is flat.     Tenderness: There is no abdominal tenderness.  Musculoskeletal:        General: Normal range of motion.     Comments: Right hip pain.  Skin:    General: Skin is warm and dry.  Neurological:     Mental Status: He is alert and oriented to person, place, and time.  Psychiatric:        Mood and Affect: Mood normal.        Behavior: Behavior normal.        Thought Content: Thought content normal.        Judgment: Judgment normal.     Imaging: US SCROTUM W/DOPPLER Result Date: 11/26/2023 CLINICAL  DATA:  Elevated testosterone EXAM: SCROTAL ULTRASOUND DOPPLER ULTRASOUND OF THE TESTICLES TECHNIQUE: Complete ultrasound examination of the testicles, epididymis, and other scrotal structures was performed. Color and spectral Doppler ultrasound were also utilized to evaluate blood flow to the testicles. COMPARISON:  None Available. FINDINGS: Right testicle Measurements: 4.1 x 2.1 x 3.0 cm. No mass or microlithiasis visualized. Left testicle Measurements: 3.6 x 2.1 x 2.8 cm. No mass or microlithiasis visualized. Right epididymis:  Normal in size and appearance. Left epididymis:  Normal in size and appearance. Hydrocele:  Small bilateral hydroceles. Varicocele:  None visualized. Pulsed Doppler interrogation of both testes demonstrates normal low resistance arterial and venous waveforms bilaterally. IMPRESSION: 1. No acute process. 2. Small bilateral hydroceles. Electronically Signed   By: Annia Belt M.D.   On: 11/26/2023 19:59    Labs:  CBC: Recent Labs    07/29/23 0915 08/04/23 0939 08/25/23 0908 12/08/23 1110  WBC 1.8* 2.4* 2.3* 2.9*  HGB 11.3* 11.0* 10.6* 10.9*  HCT 35.5* 34.9* 32.6* 32.0*  PLT 156 181 190 212    COAGS: Recent Labs    06/21/23 1000 07/25/23 1043 09/06/23 0828 12/08/23 1110  INR 1.1 1.0 1.2 1.2    BMP: Recent Labs    06/07/23 0454 06/08/23 0827 06/09/23 0419 06/21/23 1000 07/25/23 1043 08/25/23 0908 09/06/23 0828  NA 129* 129* 131* 135 139 135 138  K 4.3 4.2 3.7 4.1 4.1 3.6 3.3*  CL 96* 99 102 101 105 101 101  CO2 25 23 22 25 20 24 22   GLUCOSE 132* 159* 102* 112* 87 104* 75  BUN 17 14 11 10 8 7 6   CALCIUM 8.5* 8.4* 8.2* 9.2 9.3 9.1 9.3  CREATININE 1.11 1.18 1.07 0.90 1.02 0.95 0.99  GFRNONAA >60 >60 >60  --   --  >60  --     LIVER FUNCTION TESTS: Recent Labs    06/09/23 0419 06/21/23 1000 07/25/23 1043 08/25/23 0908 09/06/23 0828  BILITOT 5.5* 5.5* 2.4* 6.5*  6.2*  AST 79* 47* 45* 114* 60*  ALT 34 23 30 84* 42  ALKPHOS 196*  --  303* 171*  184*  PROT 7.3 8.2* 7.0 7.5 7.0  ALBUMIN 2.9*  --  3.8* 3.0* 3.5*    TUMOR MARKERS: No results for input(s): "AFPTM", "CEA", "CA199", "CHROMGRNA" in the last 8760 hours.  Assessment and Plan: Per Dr. Larita Fife H&P note on 11/22/23: Antonio Allen is a 48 y.o. male with GERD , alcohol use disorder , newly diagnosed cirrhosis with recent hospitalization for severe anemia hemoglobin 4.9 status post an upper endoscopy and colonoscopy who presents for evaluation of anemia and newly diagnosed cirrhotic, elevated liver enzymes , iron deficiency anemia (ferritin 13) and painless hematochezia with recent labs also concerning for PBC   Recommendation for liver biopsy to evaluate for PBC-AIH overlap given persistently elevated liver enzymes despite ETOH cessation.  Risks and benefits of random liver biopsy was discussed with the patient and/or patient's family including, but not limited to bleeding, infection, damage to adjacent structures or low yield requiring additional tests.  All of the questions were answered and there is agreement to proceed.  Consent signed and in chart.  Thank you for allowing our service to participate in Antonio Allen 's care.  Electronically Signed: Sable Feil, PA-C   12/08/2023, 11:58 AM      I spent a total of  30 Minutes   in face to face in clinical consultation, greater than 50% of which was counseling/coordinating care for primary biliary cholangitis with consideration for random liver biopsy.

## 2023-12-08 ENCOUNTER — Ambulatory Visit (HOSPITAL_COMMUNITY)
Admission: RE | Admit: 2023-12-08 | Discharge: 2023-12-08 | Disposition: A | Discharge status: Home / Self Care | Payer: 59 | Source: Ambulatory Visit | Attending: Gastroenterology | Admitting: Gastroenterology

## 2023-12-08 ENCOUNTER — Other Ambulatory Visit: Payer: Self-pay

## 2023-12-08 DIAGNOSIS — D509 Iron deficiency anemia, unspecified: Secondary | ICD-10-CM | POA: Diagnosis not present

## 2023-12-08 DIAGNOSIS — K219 Gastro-esophageal reflux disease without esophagitis: Secondary | ICD-10-CM | POA: Diagnosis not present

## 2023-12-08 DIAGNOSIS — N433 Hydrocele, unspecified: Secondary | ICD-10-CM | POA: Diagnosis not present

## 2023-12-08 DIAGNOSIS — K743 Primary biliary cirrhosis: Secondary | ICD-10-CM | POA: Diagnosis present

## 2023-12-08 DIAGNOSIS — K760 Fatty (change of) liver, not elsewhere classified: Secondary | ICD-10-CM | POA: Insufficient documentation

## 2023-12-08 DIAGNOSIS — K7030 Alcoholic cirrhosis of liver without ascites: Secondary | ICD-10-CM | POA: Insufficient documentation

## 2023-12-08 LAB — PROTIME-INR
INR: 1.2 (ref 0.8–1.2)
Prothrombin Time: 15.2 s (ref 11.4–15.2)

## 2023-12-08 LAB — CBC
HCT: 32 % — ABNORMAL LOW (ref 39.0–52.0)
Hemoglobin: 10.9 g/dL — ABNORMAL LOW (ref 13.0–17.0)
MCH: 31.6 pg (ref 26.0–34.0)
MCHC: 34.1 g/dL (ref 30.0–36.0)
MCV: 92.8 fL (ref 80.0–100.0)
Platelets: 212 10*3/uL (ref 150–400)
RBC: 3.45 MIL/uL — ABNORMAL LOW (ref 4.22–5.81)
RDW: 14.8 % (ref 11.5–15.5)
WBC: 2.9 10*3/uL — ABNORMAL LOW (ref 4.0–10.5)
nRBC: 0 % (ref 0.0–0.2)

## 2023-12-08 MED ORDER — MIDAZOLAM HCL 2 MG/2ML IJ SOLN
INTRAMUSCULAR | Status: AC | PRN
Start: 2023-12-08 — End: 2023-12-08
  Administered 2023-12-08: 2 mg via INTRAVENOUS

## 2023-12-08 MED ORDER — FENTANYL CITRATE (PF) 100 MCG/2ML IJ SOLN
INTRAMUSCULAR | Status: AC | PRN
  Administered 2023-12-08 (×2): 50 ug via INTRAVENOUS

## 2023-12-08 MED ORDER — MIDAZOLAM HCL 2 MG/2ML IJ SOLN
INTRAMUSCULAR | Status: AC
  Filled 2023-12-08: qty 2

## 2023-12-08 MED ORDER — LIDOCAINE HCL (PF) 1 % IJ SOLN
10.0000 mL | Freq: Once | INTRAMUSCULAR | Status: AC
  Administered 2023-12-08: 10 mL via INTRADERMAL

## 2023-12-08 MED ORDER — FENTANYL CITRATE (PF) 100 MCG/2ML IJ SOLN
INTRAMUSCULAR | Status: AC
Start: 2023-12-08 — End: ?
  Filled 2023-12-08: qty 2

## 2023-12-08 MED ORDER — SODIUM CHLORIDE 0.9 % IV SOLN: INTRAVENOUS | Status: DC

## 2023-12-08 NOTE — Procedures (Signed)
Interventional Radiology Procedure Note  Procedure: Random liver biopsy  Complications: None  Estimated Blood Loss: None  Recommendations: - Bedrest x 2 hrs - DC home  Signed,  Jadin Creque K. Xyon Lukasik, MD    

## 2023-12-09 ENCOUNTER — Ambulatory Visit (HOSPITAL_COMMUNITY): Payer: 59

## 2023-12-12 ENCOUNTER — Other Ambulatory Visit (HOSPITAL_COMMUNITY): Payer: Self-pay | Admitting: Family Medicine

## 2023-12-12 ENCOUNTER — Ambulatory Visit (HOSPITAL_COMMUNITY)
Admission: RE | Admit: 2023-12-12 | Discharge: 2023-12-12 | Disposition: A | Discharge status: Home / Self Care | Payer: 59 | Source: Ambulatory Visit | Attending: Family Medicine | Admitting: Family Medicine

## 2023-12-12 DIAGNOSIS — M7989 Other specified soft tissue disorders: Secondary | ICD-10-CM | POA: Diagnosis present

## 2023-12-12 DIAGNOSIS — M79604 Pain in right leg: Secondary | ICD-10-CM

## 2023-12-12 LAB — SURGICAL PATHOLOGY

## 2023-12-13 ENCOUNTER — Encounter (INDEPENDENT_AMBULATORY_CARE_PROVIDER_SITE_OTHER): Payer: Self-pay | Admitting: Gastroenterology

## 2023-12-13 NOTE — Progress Notes (Signed)
 Liver biopsy results :  FINAL MICROSCOPIC DIAGNOSIS:  A. LIVER, NEEDLE CORE BIOPSY: - Non-necrotizing granulomatous inflammation, See comment - Cirrhosis (stage 4 of 4) - Ductopenia      COMMENT:  The biopsy is adequate for review with core biopsies of liver showing well-developed septation and nodularity. There is granulomatous hepatitis characterized by non-necrotizing granulomas. Portal tracts and fibrous septa show moderate mixed inflammation. There is loss of native bile ducts with prominent ductular reaction and associated prominent neutrophilic reaction. Florid duct lesions are not identified. AFB and GMS special stains are negative for mycobacteria or fungal organisms. Iron stain shows granular iron deposition mostly within the Kupffer cells. PAS-D stain shows no further pathologic features. CK7 immunohistochemical stain confirms ductopenia with prominent ductular reaction and focal cholate stasis. Trichrome and reticulin stains highlight cirrhosis.  Though differential diagnosis can also include sarcoidosis, based on patients positive AMA studies, findings are consistent with primary biliary cholangitis (PBC) with cirrhosis.

## 2023-12-15 ENCOUNTER — Encounter (INDEPENDENT_AMBULATORY_CARE_PROVIDER_SITE_OTHER): Payer: Self-pay | Admitting: Gastroenterology

## 2023-12-15 DIAGNOSIS — D500 Iron deficiency anemia secondary to blood loss (chronic): Secondary | ICD-10-CM

## 2023-12-21 ENCOUNTER — Ambulatory Visit (HOSPITAL_COMMUNITY)
Admission: RE | Admit: 2023-12-21 | Discharge: 2023-12-21 | Disposition: A | Discharge status: Home / Self Care | Payer: 59 | Source: Ambulatory Visit | Attending: Gastroenterology | Admitting: Gastroenterology

## 2023-12-21 DIAGNOSIS — K7030 Alcoholic cirrhosis of liver without ascites: Secondary | ICD-10-CM | POA: Insufficient documentation

## 2023-12-21 DIAGNOSIS — K743 Primary biliary cirrhosis: Secondary | ICD-10-CM | POA: Insufficient documentation

## 2023-12-21 DIAGNOSIS — K769 Liver disease, unspecified: Secondary | ICD-10-CM | POA: Insufficient documentation

## 2023-12-21 DIAGNOSIS — K766 Portal hypertension: Secondary | ICD-10-CM | POA: Insufficient documentation

## 2023-12-21 LAB — CBC WITH DIFFERENTIAL/PLATELET
Basophils Absolute: 0 10*3/uL (ref 0.0–0.2)
Basos: 1 %
EOS (ABSOLUTE): 0.4 10*3/uL (ref 0.0–0.4)
Eos: 8 %
Hematocrit: 32.8 % — ABNORMAL LOW (ref 37.5–51.0)
Hemoglobin: 11.4 g/dL — ABNORMAL LOW (ref 13.0–17.7)
Immature Grans (Abs): 0 10*3/uL (ref 0.0–0.1)
Immature Granulocytes: 0 %
Lymphocytes Absolute: 0.9 10*3/uL (ref 0.7–3.1)
Lymphs: 21 %
MCH: 31.1 pg (ref 26.6–33.0)
MCHC: 34.8 g/dL (ref 31.5–35.7)
MCV: 90 fL (ref 79–97)
Monocytes Absolute: 0.3 10*3/uL (ref 0.1–0.9)
Monocytes: 8 %
Neutrophils Absolute: 2.7 10*3/uL (ref 1.4–7.0)
Neutrophils: 62 %
Platelets: 189 10*3/uL (ref 150–450)
RBC: 3.66 x10E6/uL — ABNORMAL LOW (ref 4.14–5.80)
RDW: 13 % (ref 11.6–15.4)
WBC: 4.3 10*3/uL (ref 3.4–10.8)

## 2023-12-21 LAB — PROTIME-INR
INR: 1.1 (ref 0.9–1.2)
Prothrombin Time: 12.2 s — ABNORMAL HIGH (ref 9.1–12.0)

## 2023-12-21 LAB — COMPREHENSIVE METABOLIC PANEL
ALT: 16 IU/L (ref 0–44)
AST: 24 IU/L (ref 0–40)
Albumin: 4 g/dL — ABNORMAL LOW (ref 4.1–5.1)
Alkaline Phosphatase: 261 IU/L — ABNORMAL HIGH (ref 44–121)
BUN/Creatinine Ratio: 9 (ref 9–20)
BUN: 9 mg/dL (ref 6–24)
Bilirubin Total: 2.7 mg/dL — ABNORMAL HIGH (ref 0.0–1.2)
CO2: 24 mmol/L (ref 20–29)
Calcium: 9.2 mg/dL (ref 8.7–10.2)
Chloride: 102 mmol/L (ref 96–106)
Creatinine, Ser: 0.95 mg/dL (ref 0.76–1.27)
Globulin, Total: 2.4 g/dL (ref 1.5–4.5)
Glucose: 86 mg/dL (ref 70–99)
Potassium: 3.7 mmol/L (ref 3.5–5.2)
Sodium: 141 mmol/L (ref 134–144)
Total Protein: 6.4 g/dL (ref 6.0–8.5)
eGFR: 100 mL/min/{1.73_m2} (ref >59)

## 2023-12-21 LAB — AFP TUMOR MARKER: AFP, Serum, Tumor Marker: 2 ng/mL (ref 0.0–6.9)

## 2023-12-21 MED ORDER — GADOBUTROL 1 MMOL/ML IV SOLN
10.0000 mL | Freq: Once | INTRAVENOUS | Status: AC | PRN
  Administered 2023-12-21: 10 mL via INTRAVENOUS

## 2023-12-24 LAB — TESTOSTERONE,FREE AND TOTAL
Testosterone, Free: 5.5 pg/mL — ABNORMAL LOW (ref 6.8–21.5)
Testosterone: 1012 ng/dL — ABNORMAL HIGH (ref 264–916)

## 2023-12-26 ENCOUNTER — Encounter (INDEPENDENT_AMBULATORY_CARE_PROVIDER_SITE_OTHER): Payer: Self-pay | Admitting: Gastroenterology

## 2023-12-26 ENCOUNTER — Encounter: Payer: Self-pay | Admitting: Oncology

## 2023-12-26 ENCOUNTER — Other Ambulatory Visit (HOSPITAL_BASED_OUTPATIENT_CLINIC_OR_DEPARTMENT_OTHER): Payer: Self-pay

## 2023-12-26 MED ORDER — TRAMADOL HCL 50 MG PO TABS
50.0000 mg | ORAL_TABLET | Freq: Four times a day (QID) | ORAL | 2 refills | Status: DC
  Filled 2023-12-26: qty 60, 15d supply, fill #0

## 2023-12-26 NOTE — Progress Notes (Signed)
 Recents labs:  Hemoglobin 11.4 improved from 10.9 previously Total testosterone  1012 (elevated) Testosterone  free : 5.5 ( low) INR 1.1 Alk phos 261 uptrending, previously 184 T. bili did trend down to 2.7 Normal AST ALT now as previously they were elevated AFP 2.0

## 2023-12-26 NOTE — Progress Notes (Signed)
 MRI ABDOMEN with and without contrast :  IMPRESSION: 1. Unchanged arterially hyperenhancing lesion of the posterior right lobe of the liver, hepatic segment VII, measuring 0.9 cm, appearance of which on today's examination is strongly suggestive of a benign flash filling hemangioma, however remains characterized technically as a LI-RADS category 3 lesion. No new liver lesions.  2. Hepatosplenomegaly with recanalization of the umbilical vein, umbilical varices, splenic, gastroesophageal, and left retroperitoneal varices, and trace ascites. Findings are consistent with cirrhosis and portal hypertension.  ================  Repeat surveillance in 3-6 months

## 2024-01-04 ENCOUNTER — Ambulatory Visit (INDEPENDENT_AMBULATORY_CARE_PROVIDER_SITE_OTHER): Payer: 59 | Admitting: Gastroenterology

## 2024-01-04 ENCOUNTER — Encounter (INDEPENDENT_AMBULATORY_CARE_PROVIDER_SITE_OTHER): Payer: Self-pay | Admitting: Gastroenterology

## 2024-01-04 VITALS — BP 137/71 | HR 60 | Temp 98.4°F | Ht 72.0 in | Wt 211.6 lb

## 2024-01-04 DIAGNOSIS — R748 Abnormal levels of other serum enzymes: Secondary | ICD-10-CM | POA: Diagnosis not present

## 2024-01-04 DIAGNOSIS — D500 Iron deficiency anemia secondary to blood loss (chronic): Secondary | ICD-10-CM

## 2024-01-04 DIAGNOSIS — K743 Primary biliary cirrhosis: Secondary | ICD-10-CM | POA: Insufficient documentation

## 2024-01-04 DIAGNOSIS — K769 Liver disease, unspecified: Secondary | ICD-10-CM | POA: Insufficient documentation

## 2024-01-04 DIAGNOSIS — L299 Pruritus, unspecified: Secondary | ICD-10-CM | POA: Diagnosis not present

## 2024-01-04 DIAGNOSIS — K7460 Unspecified cirrhosis of liver: Secondary | ICD-10-CM

## 2024-01-04 NOTE — Progress Notes (Signed)
 [name] , M.D. Gastroenterology & Hepatology [facility] Gastroenterology [address] [place], [place] [address] Primary Care Physician: [name], MD [address] [place] [address]   History of Present Illness:  Antonio Allen is a 48 y.o. male with GERD , prior alcohol use disorder (been sober for 7 months -v PETH), PBC cirrhosis is here for follow up on PBC complicated with cirrhosis , elevated liver enzymes,LIRAD 3 hepatic lesion, iron deficiency anemia   Patient had a positive AMA , chronic alk phos elevated and was started on ursodiol with clinical diagnosis of PBC. After starting ursodiol , ALP improved but he now had elevated AST>ALT and T. bili elevation . This has improved in most recent blood work  Denies any medications , alcohol use and fever and chills . Denies any confusion or abdominal pain . MRI Abdomen MRCP which demonstrated cirrhosis with stigmata of portal hypertension and LIRAD 3 Lesion  Since I saw the patient last he is currently doing well.  No further hematochezia, improved anemia, improved pruritus but residual although he takes once or twice a day and not optimal dose and has been uptitrating carvedilol now taking 6.25 twice daily  Patient have not had alcohol for past 7 months, not even single drink. Previously he was drinking vodka daily for many years.  Previous complaints of hematochezia has resolved   Continues to complain of fatigue. Patient was not able to tolerate hydroxyzine   Last EGD:11/2023  - Portal hypertensive gastropathy. - Normal duodenal bulb and second portion of the duodenum. - Successful completion of the Video Capsule Enteroscope placement. - No specimens collected. - Note previously seen bleeding area and ulcer have healed but this exam appears to be worse than previous one in terms of progression of portal hypertensive gastropathy - IDA can be explained by PHG but given profound weight  loss and initially with profound exam video capsule was deployed to examine small bowel - Also note that while deploying the capsule there was contact and trauma to the antrum and small amount of self- limiting oozing hence blood in first part of duodenum may be seen in video capsule   06/07/2023  Patchy severe inflammation with hemorrhage characterized by adherent blood, congestion ( edema) , erythema and mucus was found in the gastric fundus and in the gastric body.  - Normal esophagus.  - Gastritis with hemorrhage.  Treated with argon plasma coagulation (APC).  - gastric ulcer , Scar in the gastric antrum.  Biopsied.  - Duodenitis.   A. GASTRIC, BIOPSY: Antral and oxyntic mucosa with reactive gastropathy. Negative for Helicobacter pylori.  Last Colonoscopy:06/09/2023  - No evidence of old or active bleeding throughout the colon and 10 cm into Ileum - Diverticulosis in the left colon.  - Non- bleeding external and internal hemorrhoids.  - No specimens collected.   Small bowel capsule 11/2023  There was self-limiting bleeding seen in the duodenal bulb which was also seen during endoscopy which was due to scope trauma while deploying the capsule and from friable portal hypertensive gastropathy . No masses or active bleeding seen otherwise    IDA can be explained by PHG (Portal hypertensive gastropathy)  as even while deploying capsule there was some self limiting oozing encountered during EGD and capsule.   Negative ANA, ASMA AFP<1.8 1.8 IgG 2400 Ceruloplasmin elevated  Hep A IgM negative hep B nonexposed hep C negative.  HIV negative  FHx: neg for any gastrointestinal/liver disease, no  malignancies Social: Ex-AUD   Past Medical History: Past Medical History:  Diagnosis Date   Alcohol dependence (HCC)    Atypical mole 05/30/2018   moderate atypia on left upper arm   Atypical mole 05/30/2018   mild atypia on mid chest   GERD (gastroesophageal reflux disease)     Past  Surgical History: Past Surgical History:  Procedure Laterality Date   BIOPSY  06/07/2023   Procedure: BIOPSY;  Surgeon: Dolores Frame, MD;  [facility];  Service: Gastroenterology;;   CERVICAL FUSION     C6   COLONOSCOPY WITH PROPOFOL N/A 06/09/2023   Procedure: COLONOSCOPY WITH PROPOFOL;  Surgeon: Franky Macho, MD;  [facility]: [facility];  Service: Endoscopy;  Laterality: N/A;   ESOPHAGOGASTRODUODENOSCOPY N/A 11/24/2023   Procedure: ESOPHAGOGASTRODUODENOSCOPY (EGD);  Surgeon: Franky Macho, MD;  [facility];  Service: Endoscopy;  Laterality: N/A;  10:30AM;ASA 1-2   ESOPHAGOGASTRODUODENOSCOPY (EGD) WITH PROPOFOL N/A 06/07/2023   Procedure: ESOPHAGOGASTRODUODENOSCOPY (EGD) WITH PROPOFOL;  Surgeon: Dolores Frame, MD;  [facility];  Service: Gastroenterology;  Laterality: N/A;   GIVENS CAPSULE STUDY N/A 11/24/2023   Procedure: GIVENS CAPSULE STUDY;  Surgeon: Franky Macho, MD;  [facility];  Service: Endoscopy;  Laterality: N/A;  10:30AM;ASA 1-2   HOT HEMOSTASIS  06/07/2023   Procedure: HOT HEMOSTASIS (ARGON PLASMA COAGULATION/BICAP);  Surgeon: Marguerita Merles, Reuel Boom, MD;  Location: [facility];  Service: Gastroenterology;;   ORIF ANKLE FRACTURE Left 08/08/2013   Procedure: OPEN REDUCTION INTERNAL FIXATION (ORIF) LEFT ANKLE FRACTURE;  Surgeon: Jacki Cones, MD;  Location: [facility];  Service: Orthopedics;  Laterality: Left;   TONSILLECTOMY      Family History:History reviewed. No pertinent family history.  Social History: Social History   Tobacco Use  Smoking Status Never   Passive exposure: Past  Smokeless Tobacco Never   Social History   Substance and Sexual Activity  Alcohol Use Not Currently   Comment: 1 fifth of liquor daily   Social History   Substance and Sexual Activity  Drug Use No    Allergies: Allergies  Allergen Reactions   Bee Venom Swelling    Medications: Current  Outpatient Medications  Medication Sig Dispense Refill   carvedilol (COREG) 3.125 MG tablet Take 1 tablet (3.125 mg total) by mouth 2 (two) times daily with a meal. (Patient taking differently: Take 3.125 mg by mouth 2 (two) times daily with a meal. Two tablets bid) 180 tablet 0   hydrOXYzine (ATARAX) 25 MG tablet Take 25 mg by mouth every 6 (six) hours.     LORazepam (ATIVAN) 0.5 MG tablet Take 1 tablet (0.5 mg total) by mouth 2 (two) times daily as needed 60 tablet 0   ondansetron (ZOFRAN) 4 MG tablet Take 1 tablet (4 mg total) by mouth every 8 (eight) hours as needed for nausea or vomiting. 30 tablet 1   pantoprazole (PROTONIX) 40 MG tablet Take 1 tablet (40 mg total) by mouth daily. 30 tablet 2   tiZANidine (ZANAFLEX) 4 MG tablet Take 4 mg by mouth every 6 (six) hours.     traMADol (ULTRAM) 50 MG tablet Take 50 mg by mouth every 6 (six) hours.     ursodiol (URSO FORTE) 500 MG tablet Take 1 tablet (500 mg total) by mouth 3 (three) times daily. 90 tablet 5   Vitamin D, Ergocalciferol, (DRISDOL) 1.25 MG (50000 UNIT) CAPS capsule Take 1 capsule (50,000 Units total) by mouth every 7 (seven) days.  Then take 2000 units OTC daily 12 capsule 0   cholestyramine (QUESTRAN) 4 g packet Take 1 packet (4 g total) by mouth 2 (two) times daily. (Patient not taking: Reported on 01/04/2024) 60 each 2   escitalopram (LEXAPRO) 10 MG tablet Take 1 tablet (10 mg total) by mouth daily. (Patient not taking: Reported on 11/22/2023) 90 tablet 1   No current facility-administered medications for this visit.    Review of Systems: GENERAL: negative for malaise, night sweats HEENT: No changes in hearing or vision, no nose bleeds or other nasal problems. NECK: Negative for lumps, goiter, pain and significant neck swelling RESPIRATORY: Negative for cough, wheezing CARDIOVASCULAR: Negative for chest pain, leg swelling, palpitations, orthopnea GI: SEE HPI MUSCULOSKELETAL: Negative for joint pain or swelling, back pain, and  muscle pain. SKIN: Negative for lesions, rash HEMATOLOGY Negative for prolonged bleeding, bruising easily, and swollen nodes. ENDOCRINE: Negative for cold or heat intolerance, polyuria, polydipsia and goiter. NEURO: negative for tremor, gait imbalance, syncope and seizures. The remainder of the review of systems is noncontributory.   Physical Exam: BP 137/71 (BP Location: Left Arm, Patient Position: Sitting, Cuff Size: Normal)   Pulse 60   Temp 98.4 F (36.9 C) (Temporal)   Ht 6' (1.829 m)   Wt 211 lb 9.6 oz (96 kg)   BMI 28.70 kg/m  GENERAL: The patient is AO x3, in no acute distress. HEENT: Head is normocephalic and atraumatic. EOMI are intact. Mouth is well hydrated and without lesions. NECK: Supple. No masses LUNGS: Clear to auscultation. No presence of rhonchi/wheezing/rales. Adequate chest expansion HEART: RRR, normal s1 and s2. ABDOMEN: Soft, nontender, no guarding, no peritoneal signs, and nondistended. BS +. No masses. EXTREMITIES: Without any cyanosis, clubbing, rash, lesions or edema. NEUROLOGIC: AOx3, no focal motor deficit. SKIN: no jaundice, no rashes   Imaging/Labs: as above     Latest Ref Rng & Units 12/20/2023    9:30 AM 12/08/2023   11:10 AM 08/25/2023    9:08 AM  CBC  WBC 3.4 - 10.8 x10E3/uL 4.3  2.9  2.3   Hemoglobin 13.0 - 17.7 g/dL 09.8  11.9  14.7   Hematocrit 37.5 - 51.0 % 32.8  32.0  32.6   Platelets 150 - 450 x10E3/uL 189  212  190    Lab Results  Component Value Date   IRON 98 08/25/2023   TIBC 311 08/25/2023   FERRITIN 90 08/25/2023    I personally reviewed and interpreted the available labs, imaging and endoscopic files.  INR 1.1 Alk phos 250 AST 47 Hemoglobin 9.5 AMA very positive 48 Hepatitis A immune Hepatitis B has been nonimmune  11/2023  Hemoglobin 11.4 improved from 10.9 previously Total testosterone 1012 (elevated) Testosterone free : 5.5 ( low) INR 1.1 Alk phos 261 uptrending, previously 184 T. bili did trend down to  2.7 Normal AST ALT now as previously they were elevated AFP 2.0  12/2023  MRI ABDOMEN with and without contrast :   IMPRESSION: 1. Unchanged arterially hyperenhancing lesion of the posterior right lobe of the liver, hepatic segment VII, measuring 0.9 cm, appearance of which on today's examination is strongly suggestive of a benign flash filling hemangioma, however remains characterized technically as a LI-RADS category 3 lesion. No new liver lesions.   2. Hepatosplenomegaly with recanalization of the umbilical vein, umbilical varices, splenic, gastroesophageal, and left retroperitoneal varices, and trace ascites. Findings are consistent with cirrhosis and portal hypertension.     Liver biopsy results :   FINAL MICROSCOPIC  DIAGNOSIS:   A. LIVER, NEEDLE CORE BIOPSY: - Non-necrotizing granulomatous inflammation, See comment - Cirrhosis (stage 4 of 4) - Ductopenia     COMMENT:   The biopsy is adequate for review with core biopsies of liver showing well-developed septation and nodularity. There is granulomatous hepatitis characterized by non-necrotizing granulomas. Portal tracts and fibrous septa show moderate mixed inflammation. There is loss of native bile ducts with prominent ductular reaction and associated prominent neutrophilic reaction. Florid duct lesions are not identified. AFB and GMS special stains are negative for mycobacteria or fungal organisms. Iron stain shows granular iron deposition mostly within the Kupffer cells. PAS-D stain shows no further pathologic features. CK7 immunohistochemical stain confirms ductopenia with prominent ductular reaction and focal cholate stasis. Trichrome and reticulin stains highlight cirrhosis.  Though differential diagnosis can also include sarcoidosis, based on patients positive AMA studies, findings are consistent with primary biliary cholangitis (PBC) with cirrhosis.   Impression and Plan:   Antonio Allen is a 48 y.o.  male with GERD , prior alcohol use disorder (been sober for 7 months -v PETH), Biopsy proven PBC cirrhosis is here for follow up on PBC complicated with cirrhosis , elevated liver enzymes,LIRAD 3 hepatic lesion, iron deficiency anemia   Patient clinically doing well; improved anemia, improved pruritus . Patient underwent liver biopsy , MRI and EGD with capsule since last clinic visit   #Cirrhosis  MELD 3.0: 11 at 12/20/2023  9:25 AM MELD-Na: 11 at 12/20/2023  9:25 AM Calculated from: Serum Creatinine: 0.95 mg/dL (Using min of 1 mg/dL) at 10/23/1094  0:45 AM Serum Sodium: 141 mmol/L (Using max of 137 mmol/L) at 12/20/2023  9:25 AM Total Bilirubin: 2.7 mg/dL at 4/0/9811  9:14 AM Serum Albumin: 4 g/dL (Using max of 3.5 g/dL) at 04/25/2955  2:13 AM INR(ratio): 1.1 at 12/20/2023  9:25 AM Age at listing (hypothetical): 47 years Sex: Male at 12/20/2023  9:25 AM  Predicted Postoperative Surgical Outcomes by the VOCAL-Penn Score:     30-day mortality: 1.1%     90-day mortality: 1.6%     180-day mortality: 2.3%     90-day decompensation: 4.3%   #Eitology :PBC  Negative ANA, ASMA AFP<1.8 1.8 IgG 2400 Ceruloplasmin elevated Hep A IgM negative hep B nonexposed hep C negative.  HIV negative AMA very positive 48 Hepatitis A immune Hepatitis B has been nonimmune Biopsy proven PBC with Cirrhosis   # Hepatic encephalopathy None - Avoid opiates or benzodiazepines   # Ascites - Low sodium diet None on imaging   # Esophageal varices - Last EGD 11/2023, no varices. Repeat every 1-2 years   - patient has signs of portal Hypertension , has been uptitrating Carvedilol now to  6.125 mg BID with goal SBP: and HR: 55-60 bpm for primary ppx  # HCC screening - Last abdominal imaging 05/2023. Last MRI 12/2022 with 0.9 cm LIRAD 3 lesion . Surveillance MRI in 6 months - Last AFP :2.0   Recommendation  -Follow up with liver transplant hepatologist  - As per Vocal PENN surgical risk score 30-day  mortality: 1.1%    , which is low for orthopedic procedure  - Check CBC, MELD labs in 6 months - Reduce salt intake to <2 g per day - Can take Tylenol max of 2 g per day (650 mg q8h) for pain - Avoid NSAIDs for pain - Avoid eating raw oysters/shellfish -Hep B vaccination completed -Patient is on Carvediolol 6.125 BID  #Primary biliary cholangitis (PBC)  Recommendation:  -Liver  tests every 3?6 months -TSH annually -Bone mineral densitometry every 2 years- by PCP, patient will follow up for this  -Upper endoscopy every  1?3 years given cirrhotic with possible PBC -Ultrasound with or without alpha fetoprotein every 6 months -Patients with elevated lipid levels and at risk for cardiovascular disease can be considered for lipid?lowering therapy.; follow up with PCP -1,000 to 1,500 mg of calcium and 1,000 International Units of vitamin D daily in the diet and as supplements if needed. Oral alendronate (70 mg weekly) or other effective bisphosphonates should be considered if patient is osteoporotic. -If  Model for End?Stage Liver Disease score remains elevated than 15, total bilirubin greater than 6 mg/dL,Severe intractable pruritus is an exceptional indication for liver transplantation. Chronic fatigue is not an indication for transplant because this symptom is not universally reversible after liver transplantation. -continue UDCA in a dose of 13 to 15 mg/kg/day orally   #Iron deficiency anemia- improved with new labs  Most recent labs with HBG 12 and resolving IDA even without supplementation Patient has fatigue likely constellation of PBC  IDA can be explained by PHG (Portal hypertensive gastropathy)  as even while deploying capsule there was some self limiting oozing encountered during EGD and capsule.   #Generalized pruritus   This is constellation of PBC. Unable to tolerate hyroxyzine Emoillents and loose clothings Improved with Urso and discontinues Cholestyramine   All questions  were answered.      [name], MD Gastroenterology and Hepatology [facility] Gastroenterology   This chart has been completed using [facility] Dictation software, and while attempts have been made to ensure accuracy , certain words and phrases may not be transcribed as intended

## 2024-01-04 NOTE — Patient Instructions (Signed)
 It was very nice to meet you today, as dicussed with will plan for the following :  1) Labs and MRI in 6months and than see me  2) Transplant hepatologist   3) GET DEX SCAN via PCP

## 2024-01-12 ENCOUNTER — Other Ambulatory Visit (HOSPITAL_BASED_OUTPATIENT_CLINIC_OR_DEPARTMENT_OTHER): Payer: Self-pay

## 2024-01-18 ENCOUNTER — Other Ambulatory Visit (HOSPITAL_BASED_OUTPATIENT_CLINIC_OR_DEPARTMENT_OTHER): Payer: Self-pay

## 2024-01-18 ENCOUNTER — Other Ambulatory Visit: Payer: Self-pay

## 2024-01-18 MED ORDER — SPIRONOLACTONE 50 MG PO TABS
50.0000 mg | ORAL_TABLET | Freq: Every day | ORAL | 5 refills | Status: AC
  Filled 2024-01-18 (×2): qty 30, 30d supply, fill #0
  Filled 2024-02-16: qty 30, 30d supply, fill #1
  Filled 2024-03-24 – 2024-04-04 (×3): qty 30, 30d supply, fill #2

## 2024-01-25 ENCOUNTER — Encounter (INDEPENDENT_AMBULATORY_CARE_PROVIDER_SITE_OTHER): Payer: Self-pay | Admitting: Gastroenterology

## 2024-01-26 ENCOUNTER — Ambulatory Visit (HOSPITAL_COMMUNITY)
Admission: RE | Admit: 2024-01-26 | Discharge: 2024-01-26 | Disposition: A | Discharge status: Home / Self Care | Source: Ambulatory Visit | Attending: Family Medicine | Admitting: Family Medicine

## 2024-01-26 DIAGNOSIS — K7030 Alcoholic cirrhosis of liver without ascites: Secondary | ICD-10-CM | POA: Diagnosis present

## 2024-01-27 ENCOUNTER — Other Ambulatory Visit (HOSPITAL_BASED_OUTPATIENT_CLINIC_OR_DEPARTMENT_OTHER): Payer: Self-pay

## 2024-01-27 MED ORDER — FUROSEMIDE 20 MG PO TABS
20.0000 mg | ORAL_TABLET | Freq: Every day | ORAL | 11 refills | Status: DC
  Filled 2024-01-27: qty 30, 30d supply, fill #0

## 2024-01-31 ENCOUNTER — Telehealth (INDEPENDENT_AMBULATORY_CARE_PROVIDER_SITE_OTHER): Payer: Self-pay | Admitting: Gastroenterology

## 2024-01-31 NOTE — Telephone Encounter (Signed)
 Patient was seen by Transplant hepatologist

## 2024-02-08 ENCOUNTER — Other Ambulatory Visit (HOSPITAL_BASED_OUTPATIENT_CLINIC_OR_DEPARTMENT_OTHER): Payer: Self-pay

## 2024-02-08 ENCOUNTER — Other Ambulatory Visit (INDEPENDENT_AMBULATORY_CARE_PROVIDER_SITE_OTHER): Payer: Self-pay | Admitting: Gastroenterology

## 2024-02-08 DIAGNOSIS — K766 Portal hypertension: Secondary | ICD-10-CM

## 2024-02-08 MED ORDER — CARVEDILOL 3.125 MG PO TABS
3.1250 mg | ORAL_TABLET | Freq: Two times a day (BID) | ORAL | 0 refills | Status: DC
  Filled 2024-02-08: qty 180, 90d supply, fill #0

## 2024-02-14 ENCOUNTER — Other Ambulatory Visit (HOSPITAL_BASED_OUTPATIENT_CLINIC_OR_DEPARTMENT_OTHER): Payer: Self-pay

## 2024-02-15 ENCOUNTER — Encounter: Payer: Self-pay | Admitting: Oncology

## 2024-02-15 ENCOUNTER — Other Ambulatory Visit (HOSPITAL_BASED_OUTPATIENT_CLINIC_OR_DEPARTMENT_OTHER): Payer: Self-pay

## 2024-02-15 MED ORDER — TRAMADOL HCL 50 MG PO TABS
50.0000 mg | ORAL_TABLET | Freq: Four times a day (QID) | ORAL | 0 refills | Status: AC | PRN
  Filled 2024-02-15: qty 60, 15d supply, fill #0

## 2024-02-16 ENCOUNTER — Other Ambulatory Visit (INDEPENDENT_AMBULATORY_CARE_PROVIDER_SITE_OTHER): Payer: Self-pay | Admitting: Gastroenterology

## 2024-02-17 ENCOUNTER — Other Ambulatory Visit: Payer: Self-pay

## 2024-02-17 NOTE — Telephone Encounter (Signed)
 Last seen 01/04/24

## 2024-02-28 ENCOUNTER — Ambulatory Visit: Payer: 59 | Admitting: Internal Medicine

## 2024-02-28 ENCOUNTER — Other Ambulatory Visit (HOSPITAL_BASED_OUTPATIENT_CLINIC_OR_DEPARTMENT_OTHER): Payer: Self-pay

## 2024-02-28 MED ORDER — PANTOPRAZOLE SODIUM 40 MG PO TBEC
40.0000 mg | DELAYED_RELEASE_TABLET | Freq: Every day | ORAL | 2 refills | Status: DC
  Filled 2024-02-28: qty 30, 30d supply, fill #0
  Filled 2024-04-04: qty 30, 30d supply, fill #1
  Filled 2024-05-01: qty 30, 30d supply, fill #2

## 2024-03-02 ENCOUNTER — Other Ambulatory Visit (HOSPITAL_BASED_OUTPATIENT_CLINIC_OR_DEPARTMENT_OTHER): Payer: Self-pay

## 2024-03-02 MED ORDER — TIZANIDINE HCL 4 MG PO TABS
4.0000 mg | ORAL_TABLET | Freq: Four times a day (QID) | ORAL | 0 refills | Status: DC
  Filled 2024-03-02: qty 56, 14d supply, fill #0

## 2024-03-24 ENCOUNTER — Other Ambulatory Visit (HOSPITAL_BASED_OUTPATIENT_CLINIC_OR_DEPARTMENT_OTHER): Payer: Self-pay

## 2024-03-26 ENCOUNTER — Other Ambulatory Visit (HOSPITAL_BASED_OUTPATIENT_CLINIC_OR_DEPARTMENT_OTHER): Payer: Self-pay

## 2024-03-26 ENCOUNTER — Other Ambulatory Visit: Payer: Self-pay

## 2024-03-27 ENCOUNTER — Other Ambulatory Visit (HOSPITAL_BASED_OUTPATIENT_CLINIC_OR_DEPARTMENT_OTHER): Payer: Self-pay

## 2024-03-29 ENCOUNTER — Other Ambulatory Visit: Payer: Self-pay

## 2024-03-29 ENCOUNTER — Encounter: Payer: Self-pay | Admitting: Oncology

## 2024-03-29 DIAGNOSIS — D500 Iron deficiency anemia secondary to blood loss (chronic): Secondary | ICD-10-CM

## 2024-03-29 DIAGNOSIS — D708 Other neutropenia: Secondary | ICD-10-CM

## 2024-03-29 NOTE — Telephone Encounter (Signed)
 Spoke with patient's wife. She states that he has not passed out but has felt like he was going to. She states that his blood pressure has been running low. Patient's wife is agreeable that if he passes out or feels worse needs to go to the ED. Orders placed per Dr. Curtis Dow orders. Message sent to schedulers to schedule for lab appointment tomorrow. Patient will be checking my chart for lab appointment.

## 2024-03-30 ENCOUNTER — Inpatient Hospital Stay: Attending: Oncology

## 2024-04-10 ENCOUNTER — Ambulatory Visit: Admitting: "Endocrinology

## 2024-04-15 ENCOUNTER — Other Ambulatory Visit (HOSPITAL_BASED_OUTPATIENT_CLINIC_OR_DEPARTMENT_OTHER): Payer: Self-pay

## 2024-04-16 ENCOUNTER — Other Ambulatory Visit (HOSPITAL_BASED_OUTPATIENT_CLINIC_OR_DEPARTMENT_OTHER): Payer: Self-pay

## 2024-04-16 MED ORDER — TIZANIDINE HCL 4 MG PO TABS
4.0000 mg | ORAL_TABLET | Freq: Four times a day (QID) | ORAL | 0 refills | Status: DC
  Filled 2024-04-16: qty 56, 14d supply, fill #0

## 2024-04-17 ENCOUNTER — Other Ambulatory Visit (HOSPITAL_BASED_OUTPATIENT_CLINIC_OR_DEPARTMENT_OTHER): Payer: Self-pay

## 2024-04-17 ENCOUNTER — Encounter (HOSPITAL_BASED_OUTPATIENT_CLINIC_OR_DEPARTMENT_OTHER): Payer: Self-pay

## 2024-04-18 ENCOUNTER — Encounter: Payer: Self-pay | Admitting: Family Medicine

## 2024-04-25 ENCOUNTER — Other Ambulatory Visit (HOSPITAL_BASED_OUTPATIENT_CLINIC_OR_DEPARTMENT_OTHER): Payer: Self-pay

## 2024-04-25 MED ORDER — CARVEDILOL 3.125 MG PO TABS
6.2500 mg | ORAL_TABLET | Freq: Two times a day (BID) | ORAL | 11 refills | Status: AC
  Filled 2024-04-25: qty 120, 30d supply, fill #0
  Filled 2024-05-30 (×2): qty 120, 30d supply, fill #1
  Filled 2024-08-19: qty 120, 30d supply, fill #2
  Filled 2024-09-26: qty 120, 30d supply, fill #3

## 2024-04-25 MED ORDER — FUROSEMIDE 20 MG PO TABS
20.0000 mg | ORAL_TABLET | Freq: Every day | ORAL | 11 refills | Status: AC
  Filled 2024-04-25: qty 30, 30d supply, fill #0

## 2024-04-30 ENCOUNTER — Encounter (INDEPENDENT_AMBULATORY_CARE_PROVIDER_SITE_OTHER): Payer: Self-pay | Admitting: Gastroenterology

## 2024-04-30 ENCOUNTER — Ambulatory Visit (INDEPENDENT_AMBULATORY_CARE_PROVIDER_SITE_OTHER): Admitting: Gastroenterology

## 2024-04-30 ENCOUNTER — Telehealth (INDEPENDENT_AMBULATORY_CARE_PROVIDER_SITE_OTHER): Payer: Self-pay | Admitting: *Deleted

## 2024-04-30 VITALS — BP 123/71 | HR 60 | Temp 98.2°F | Ht 73.0 in | Wt 215.7 lb

## 2024-04-30 DIAGNOSIS — K7030 Alcoholic cirrhosis of liver without ascites: Secondary | ICD-10-CM

## 2024-04-30 DIAGNOSIS — K769 Liver disease, unspecified: Secondary | ICD-10-CM | POA: Diagnosis not present

## 2024-04-30 DIAGNOSIS — K743 Primary biliary cirrhosis: Secondary | ICD-10-CM | POA: Diagnosis not present

## 2024-04-30 DIAGNOSIS — D509 Iron deficiency anemia, unspecified: Secondary | ICD-10-CM

## 2024-04-30 DIAGNOSIS — R748 Abnormal levels of other serum enzymes: Secondary | ICD-10-CM

## 2024-04-30 DIAGNOSIS — D500 Iron deficiency anemia secondary to blood loss (chronic): Secondary | ICD-10-CM

## 2024-04-30 NOTE — Progress Notes (Signed)
 Antonio Allen , M.D. Gastroenterology & Hepatology [facility] [address] [place], [place] [address] Primary Care Physician: Marvine Rush, MD [address] [place] [address]   History of Present Illness:  Antonio Allen is a 48 y.o. male with GERD , prior alcohol use disorder (been sober for 7 months -v PETH), PBC cirrhosis is here for follow up on PBC complicated with cirrhosis , elevated liver enzymes,LIRAD 3 hepatic lesion, iron  deficiency anemia   Patient was last seen February 25 ; since then had follow-up with Dr. Jennell hepatologist at [facility] and underwent hip replacement surgery on May 2025.  Patient reports intraoperatively had some blood loss and did became anemic after that.  Denies any hematochezia or overt bleeding.  Main issue remains lower extremity edema which patient attributes it to higher doses of beta-blockers  Patient had a positive AMA , chronic alk phos elevated and was started on ursodiol  after biopsies were consistent with PBC.  Denies any medications , alcohol use and fever and chills . Denies any confusion or abdominal pain . MRI Abdomen MRCP which demonstrated cirrhosis with stigmata of portal hypertension and LIRAD 3 Lesion  Since I saw the patient last he is currently doing well.  No further hematochezia, improved anemia, improved pruritus but residual although he takes once or twice a day and not optimal dose and has been uptitrating carvedilol  now taking 6.25 twice daily  Patient have not had alcohol for past 1 year , not even single drink. Previously he was drinking vodka daily for many years.  Previous complaints of hematochezia has resolved   Continues to complain of fatigue. Patient was not able to tolerate hydroxyzine    Last labs from 04/2024 hemoglobin 8.9 MCV 89 platelet 242 Alk phos 279 normal AST ALT Last EGD:11/2023  - Portal hypertensive gastropathy. - Normal duodenal bulb  and second portion of the duodenum. - Successful completion of the Video Capsule Enteroscope placement. - No specimens collected. - Note previously seen bleeding area and ulcer have healed but this exam appears to be worse than previous one in terms of progression of portal hypertensive gastropathy - IDA can be explained by PHG but given profound weight loss and initially with profound exam video capsule was deployed to examine small bowel - Also note that while deploying the capsule there was contact and trauma to the antrum and small amount of self- limiting oozing hence blood in first part of duodenum may be seen in video capsule   06/07/2023  Patchy severe inflammation with hemorrhage characterized by adherent blood, congestion ( edema) , erythema and mucus was found in the gastric fundus and in the gastric body.  - Normal esophagus.  - Gastritis with hemorrhage.  Treated with argon plasma coagulation (APC).  - gastric ulcer , Scar in the gastric antrum.  Biopsied.  - Duodenitis.   A. GASTRIC, BIOPSY: Antral and oxyntic mucosa with reactive gastropathy. Negative for Helicobacter pylori.  Last Colonoscopy:06/09/2023  - No evidence of old or active bleeding throughout the colon and 10 cm into Ileum - Diverticulosis in the left colon.  - Non- bleeding external and internal hemorrhoids.  - No specimens collected.   Small bowel capsule 11/2023  There was self-limiting bleeding seen in the duodenal bulb which was also seen during endoscopy which was due to scope trauma while deploying the capsule and from friable portal hypertensive gastropathy . No masses or active bleeding seen otherwise    IDA can be  explained by PHG (Portal hypertensive gastropathy)  as even while deploying capsule there was some self limiting oozing encountered during EGD and capsule.   Negative ANA, ASMA AFP<1.8 1.8 IgG 2400 Ceruloplasmin elevated  Hep A IgM negative hep B nonexposed hep C negative.  HIV  negative  FHx: neg for any gastrointestinal/liver disease, no malignancies Social: Ex-AUD   Past Medical History: Past Medical History:  Diagnosis Date   Alcohol dependence (HCC)    Atypical mole 05/30/2018   moderate atypia on left upper arm   Atypical mole 05/30/2018   mild atypia on mid chest   GERD (gastroesophageal reflux disease)     Past Surgical History: Past Surgical History:  Procedure Laterality Date   BIOPSY  06/07/2023   Procedure: BIOPSY;  Surgeon: Eartha Angelia Sieving, MD;  Location: [facility];  Service: Gastroenterology;;   CERVICAL FUSION     C6   COLONOSCOPY WITH PROPOFOL  N/A 06/09/2023   Procedure: COLONOSCOPY WITH PROPOFOL ;  Surgeon: Cinderella Deatrice FALCON, MD;  Location: [facility];  Service: Endoscopy;  Laterality: N/A;   ESOPHAGOGASTRODUODENOSCOPY N/A 11/24/2023   Procedure: ESOPHAGOGASTRODUODENOSCOPY (EGD);  Surgeon: Cinderella Deatrice FALCON, MD;  Location: [facility];  Service: Endoscopy;  Laterality: N/A;  10:30AM;ASA 1-2   ESOPHAGOGASTRODUODENOSCOPY (EGD) WITH PROPOFOL  N/A 06/07/2023   Procedure: ESOPHAGOGASTRODUODENOSCOPY (EGD) WITH PROPOFOL ;  Surgeon: Eartha Angelia Sieving, MD;  [facility]: [facility];  Service: Gastroenterology;  Laterality: N/A;   GIVENS CAPSULE STUDY N/A 11/24/2023   Procedure: GIVENS CAPSULE STUDY;  Surgeon: Cinderella Deatrice FALCON, MD;  [facility]: [facility];  Service: Endoscopy;  Laterality: N/A;  10:30AM;ASA 1-2   HOT HEMOSTASIS  06/07/2023   Procedure: HOT HEMOSTASIS (ARGON PLASMA COAGULATION/BICAP);  Surgeon: Eartha Angelia, Sieving, MD;  Location: [facility];  Service: Gastroenterology;;   ORIF ANKLE FRACTURE Left 08/08/2013   Procedure: OPEN REDUCTION INTERNAL FIXATION (ORIF) LEFT ANKLE FRACTURE;  Surgeon: Tanda DELENA Heading, MD;  Location: [facility];  Service: Orthopedics;  Laterality: Left;   TONSILLECTOMY      Family History:No family history on file.  Social History: Social History   Tobacco Use  Smoking Status  Never   Passive exposure: Past  Smokeless Tobacco Never   Social History   Substance and Sexual Activity  Alcohol Use Not Currently   Comment: 1 fifth of liquor daily   Social History   Substance and Sexual Activity  Drug Use No    Allergies: Allergies  Allergen Reactions   Bee Venom Swelling    Medications: Current Outpatient Medications  Medication Sig Dispense Refill   carvedilol  (COREG ) 3.125 MG tablet Take 2 tablets (6.25 mg total) by mouth 2 (two) times daily with a meal. 120 tablet 11   furosemide  (LASIX ) 20 MG tablet Take 1 tablet (20 mg total) by mouth daily. 30 tablet 11   hydrOXYzine  (ATARAX ) 25 MG tablet Take 25 mg by mouth every 6 (six) hours.     LORazepam  (ATIVAN ) 0.5 MG tablet Take 1 tablet (0.5 mg total) by mouth 2 (two) times daily as needed 60 tablet 0   ondansetron  (ZOFRAN ) 4 MG tablet Take 1 tablet (4 mg total) by mouth every 8 (eight) hours as needed for nausea or vomiting. 30 tablet 1   pantoprazole  (PROTONIX ) 40 MG tablet Take 1 tablet (40 mg total) by mouth daily. 30 tablet 2   spironolactone  (ALDACTONE ) 50 MG tablet Take 1 tablet (50 mg total) by mouth daily. (Patient not taking: Reported on 04/30/2024) 30 tablet 5  tiZANidine  (ZANAFLEX ) 4 MG tablet Take 4 mg by mouth every 6 (six) hours.     tiZANidine  (ZANAFLEX ) 4 MG tablet Take 1 tablet (4 mg total) by mouth every 6 (six) hours. 56 tablet 0   traMADol  (ULTRAM ) 50 MG tablet Take 50 mg by mouth every 6 (six) hours.     traMADol  (ULTRAM ) 50 MG tablet Take 1 tablet (50 mg total) by mouth every 6 (six) hours as needed. 60 tablet 0   ursodiol  (URSO  FORTE) 500 MG tablet Take 1 tablet (500 mg total) by mouth 3 (three) times daily. 90 tablet 5   Vitamin D , Ergocalciferol , (DRISDOL ) 1.25 MG (50000 UNIT) CAPS capsule Take 1 capsule (50,000 Units total) by mouth every 7 (seven) days. Then take 2000 units OTC daily 12 capsule 0   No current facility-administered medications for this visit.    Review of  Systems: GENERAL: negative for malaise, night sweats HEENT: No changes in hearing or vision, no nose bleeds or other nasal problems. NECK: Negative for lumps, goiter, pain and significant neck swelling RESPIRATORY: Negative for cough, wheezing CARDIOVASCULAR: Negative for chest pain, leg swelling, palpitations, orthopnea GI: SEE HPI MUSCULOSKELETAL: Negative for joint pain or swelling, back pain, and muscle pain. SKIN: Negative for lesions, rash HEMATOLOGY Negative for prolonged bleeding, bruising easily, and swollen nodes. ENDOCRINE: Negative for cold or heat intolerance, polyuria, polydipsia and goiter. NEURO: negative for tremor, gait imbalance, syncope and seizures. The remainder of the review of systems is noncontributory.   Physical Exam: There were no vitals taken for this visit. GENERAL: The patient is AO x3, in no acute distress. HEENT: Head is normocephalic and atraumatic. EOMI are intact. Mouth is well hydrated and without lesions. NECK: Supple. No masses LUNGS: Clear to auscultation. No presence of rhonchi/wheezing/rales. Adequate chest expansion HEART: RRR, normal s1 and s2. ABDOMEN: Soft, nontender, no guarding, no peritoneal signs, and nondistended. BS +. No masses. EXTREMITIES: Without any cyanosis, clubbing, rash, lesions or edema. NEUROLOGIC: AOx3, no focal motor deficit. SKIN: no jaundice, no rashes   Imaging/Labs: as above     Latest Ref Rng & Units 12/20/2023    9:30 AM 12/08/2023   11:10 AM 08/25/2023    9:08 AM  CBC  WBC 3.4 - 10.8 x10E3/uL 4.3  2.9  2.3   Hemoglobin 13.0 - 17.7 g/dL 88.5  89.0  89.3   Hematocrit 37.5 - 51.0 % 32.8  32.0  32.6   Platelets 150 - 450 x10E3/uL 189  212  190    Lab Results  Component Value Date   IRON  98 08/25/2023   TIBC 311 08/25/2023   FERRITIN 90 08/25/2023    I personally reviewed and interpreted the available labs, imaging and endoscopic files.  INR 1.1 Alk phos 250 AST 47 Hemoglobin 9.5 AMA very positive  48 Hepatitis A immune Hepatitis B has been nonimmune  11/2023  Hemoglobin 11.4 improved from 10.9 previously Total testosterone  1012 (elevated) Testosterone  free : 5.5 ( low) INR 1.1 Alk phos 261 uptrending, previously 184 T. bili did trend down to 2.7 Normal AST ALT now as previously they were elevated AFP 2.0  12/2023  MRI ABDOMEN with and without contrast :   IMPRESSION: 1. Unchanged arterially hyperenhancing lesion of the posterior right lobe of the liver, hepatic segment VII, measuring 0.9 cm, appearance of which on today's examination is strongly suggestive of a benign flash filling hemangioma, however remains characterized technically as a LI-RADS category 3 lesion. No new liver lesions.   2. Hepatosplenomegaly  with recanalization of the umbilical vein, umbilical varices, splenic, gastroesophageal, and left retroperitoneal varices, and trace ascites. Findings are consistent with cirrhosis and portal hypertension.     Liver biopsy results :   FINAL MICROSCOPIC DIAGNOSIS:   A. LIVER, NEEDLE CORE BIOPSY: - Non-necrotizing granulomatous inflammation, See comment - Cirrhosis (stage 4 of 4) - Ductopenia     COMMENT:   The biopsy is adequate for review with core biopsies of liver showing well-developed septation and nodularity. There is granulomatous hepatitis characterized by non-necrotizing granulomas. Portal tracts and fibrous septa show moderate mixed inflammation. There is loss of native bile ducts with prominent ductular reaction and associated prominent neutrophilic reaction. Florid duct lesions are not identified. AFB and GMS special stains are negative for mycobacteria or fungal organisms. Iron  stain shows granular iron  deposition mostly within the Kupffer cells. PAS-D stain shows no further pathologic features. CK7 immunohistochemical stain confirms ductopenia with prominent ductular reaction and focal cholate stasis. Trichrome and reticulin  stains highlight cirrhosis.  Though differential diagnosis can also include sarcoidosis, based on patients positive AMA studies, findings are consistent with primary biliary cholangitis (PBC) with cirrhosis.   Impression and Plan:   ALONSO GAPINSKI is a 48 y.o. male with GERD , prior alcohol use disorder (been sober for 7 months -v PETH), PBC cirrhosis is here for follow up on PBC complicated with cirrhosis , elevated liver enzymes,LIRAD 3 hepatic lesion, iron  deficiency anemia   #Cirrhosis  MELD 3.0: 11 at 12/20/2023  9:25 AM MELD-Na: 11 at 12/20/2023  9:25 AM Calculated from: Serum Creatinine: 0.95 mg/dL (Using min of 1 mg/dL) at 03/22/7973  0:74 AM Serum Sodium: 141 mmol/L (Using max of 137 mmol/L) at 12/20/2023  9:25 AM Total Bilirubin: 2.7 mg/dL at 03/22/7973  0:74 AM Serum Albumin: 4 g/dL (Using max of 3.5 g/dL) at 03/22/7973  0:74 AM INR(ratio): 1.1 at 12/20/2023  9:25 AM Age at listing (hypothetical): 47 years Sex: Male at 12/20/2023  9:25 AM  #Eitology :PBC  Negative ANA, ASMA AFP<1.8 1.8 IgG 2400 Ceruloplasmin elevated Hep A IgM negative hep B nonexposed hep C negative.  HIV negative AMA very positive 48 Hepatitis A immune Hepatitis B has been nonimmune Biopsy proven PBC with Cirrhosis   # Hepatic encephalopathy None - Avoid opiates or benzodiazepines   # Ascites Patient currently on spironolactone  50 mg, intermittently , but stopped taking due to frequency - Low sodium diet None on imaging   # Esophageal varices - Last EGD 11/2023, no varices. Repeat every 1-2 years   - patient has signs of portal Hypertension , has been uptitrating Carvedilol  now to  6.125 mg BID with goal SBP: and HR: 55-60 bpm for primary ppx.  Patient started taking 3.125 twice daily dose because complains of lower extremity edema  # HCC screening - Last abdominal imaging 05/2023. Last MRI 12/2022 with 0.9 cm LIRAD 3 lesion . Surveillance MRI in 6 months - Last AFP :2.0   Recommendation   -Will obtain MRI abdomen with and without contrast with LIRAD 3 lesion -Follow up with liver transplant hepatologist in 6 to 9 months - Check CBC, MELD labs in 6 months - Reduce salt intake to <2 g per day - Can take Tylenol  max of 2 g per day (650 mg q8h) for pain - Avoid NSAIDs for pain - Avoid eating raw oysters/shellfish -Hep B vaccination completed -Patient is on Carvediolol 3.125 BID, unable to tolerate 6.125 twice daily given lower extremity edema.  Agree with Dr. Myrna  approach of adding diuretics in order to introduce maximal dose nonselective beta-blocker which would prevent further decompensation including management of portal hypertensive gastropathy  #Primary biliary cholangitis (PBC)  Recommendation:  -Liver tests every 3?6 months -TSH annually -Bone mineral densitometry every 2 years- by PCP, patient will follow up for this  -Upper endoscopy every  1? ended some blood loss anemia as expected years given cirrhotic with PBC -Ultrasound with or without alpha fetoprotein every 6 months -Patients with elevated lipid levels and at risk for cardiovascular disease can be considered for lipid?lowering therapy.; follow up with PCP -1,000 to 1,500 mg of calcium and 1,000 International Units of vitamin D  daily in the diet and as supplements if needed. Oral alendronate (70 mg weekly) or other effective bisphosphonates should be considered if patient is osteoporotic. -If  Model for End?Stage Liver Disease score remains elevated than 15, total bilirubin greater than 6 mg/dL,Severe intractable pruritus is an exceptional indication for liver transplantation. Chronic fatigue is not an indication for transplant because this symptom is not universally reversible after liver transplantation. -continue UDCA in a dose of 13 to 15 mg/kg/day orally   #Anemia   Patient had surgery May 2025 with some blood loss intraoperatively.  Anemia is expected but will improve soon  Previously had iron   deficiency anemia with resolution with hemoglobin HBG 12 even without supplementation IDA can be explained by PHG (Portal hypertensive gastropathy)  as even while deploying capsule there was some self limiting oozing encountered during EGD and capsule.   Will repeat CBC iron  profile vitamin B12 and folic  If patient continues to be anemic may need repeat upper endoscopy with possible ablation of any severe portal hypertensive gastropathy  #Generalized pruritus   This is constellation of PBC. Unable to tolerate hyroxyzine Emoillents and loose clothings Improved with Urso     All questions were answered.      Jaron Czarnecki Faizan Brewer Hitchman, MD Gastroenterology and Hepatology [facility]astroenter[facility]   This chart has been completed using [facility] Dictation software, and while attempts have been made to ensure accuracy , certain words and phrases may not be transcribed as intended  Follow-up

## 2024-04-30 NOTE — Telephone Encounter (Signed)
 Per [employer]icore and availity  Message PRECERT IS NOT REQUIRED OR ONLY REQUIRED IN UNIQUE CIRCUMSTANCES FOR MEDICAID REFER TO PRIOR AUTH TOOL ON [email or web address] FOR ALL OTHERS REFER TO CODE SEARCH TOOL ON [email or web address] COVERAGE OF SERVICES ARE SUBJECT TO BENEFITS AND ELIGIBILITY

## 2024-04-30 NOTE — Telephone Encounter (Signed)
 LMOVM to call back to give MRI appt details 7/22, arrival 8:15am, npo 4 hrs prior.

## 2024-04-30 NOTE — Patient Instructions (Signed)
 It was very nice to meet you today, as dicussed with will plan for the following :  1) MRI   2) labs 1 week before seeing the hematologist   3) continue ursodiol  and carvediolol

## 2024-05-08 ENCOUNTER — Ambulatory Visit (HOSPITAL_COMMUNITY): Admission: RE | Admit: 2024-05-08 | Source: Ambulatory Visit

## 2024-05-20 ENCOUNTER — Encounter: Payer: Self-pay | Admitting: Oncology

## 2024-05-21 NOTE — Telephone Encounter (Signed)
 Dr. Cinderella, please advise if patient is due for the MRI now or 6 months from the last one? Encounter form did not say so we scheduled for now.

## 2024-05-25 ENCOUNTER — Inpatient Hospital Stay: Payer: 59

## 2024-05-25 ENCOUNTER — Other Ambulatory Visit: Payer: 59

## 2024-05-28 ENCOUNTER — Inpatient Hospital Stay: Attending: Oncology

## 2024-05-30 ENCOUNTER — Inpatient Hospital Stay: Attending: Oncology

## 2024-05-30 ENCOUNTER — Other Ambulatory Visit (INDEPENDENT_AMBULATORY_CARE_PROVIDER_SITE_OTHER): Payer: Self-pay | Admitting: Gastroenterology

## 2024-05-30 ENCOUNTER — Other Ambulatory Visit: Payer: Self-pay | Admitting: Oncology

## 2024-05-30 ENCOUNTER — Encounter (INDEPENDENT_AMBULATORY_CARE_PROVIDER_SITE_OTHER): Payer: Self-pay | Admitting: *Deleted

## 2024-05-30 DIAGNOSIS — K922 Gastrointestinal hemorrhage, unspecified: Secondary | ICD-10-CM | POA: Diagnosis not present

## 2024-05-30 DIAGNOSIS — Z91030 Bee allergy status: Secondary | ICD-10-CM | POA: Insufficient documentation

## 2024-05-30 DIAGNOSIS — K743 Primary biliary cirrhosis: Secondary | ICD-10-CM | POA: Insufficient documentation

## 2024-05-30 DIAGNOSIS — R161 Splenomegaly, not elsewhere classified: Secondary | ICD-10-CM | POA: Insufficient documentation

## 2024-05-30 DIAGNOSIS — K766 Portal hypertension: Secondary | ICD-10-CM | POA: Insufficient documentation

## 2024-05-30 DIAGNOSIS — D500 Iron deficiency anemia secondary to blood loss (chronic): Secondary | ICD-10-CM | POA: Insufficient documentation

## 2024-05-30 DIAGNOSIS — Z79899 Other long term (current) drug therapy: Secondary | ICD-10-CM | POA: Insufficient documentation

## 2024-05-30 DIAGNOSIS — K7030 Alcoholic cirrhosis of liver without ascites: Secondary | ICD-10-CM | POA: Insufficient documentation

## 2024-05-30 DIAGNOSIS — K3189 Other diseases of stomach and duodenum: Secondary | ICD-10-CM | POA: Insufficient documentation

## 2024-05-30 DIAGNOSIS — R5383 Other fatigue: Secondary | ICD-10-CM | POA: Diagnosis not present

## 2024-05-30 DIAGNOSIS — D72819 Decreased white blood cell count, unspecified: Secondary | ICD-10-CM | POA: Insufficient documentation

## 2024-05-30 LAB — COMPREHENSIVE METABOLIC PANEL WITH GFR
ALT: 17 U/L (ref 0–44)
AST: 29 U/L (ref 15–41)
Albumin: 3.6 g/dL (ref 3.5–5.0)
Alkaline Phosphatase: 231 U/L — ABNORMAL HIGH (ref 38–126)
Anion gap: 10 (ref 5–15)
BUN: 17 mg/dL (ref 6–20)
CO2: 22 mmol/L (ref 22–32)
Calcium: 9.5 mg/dL (ref 8.9–10.3)
Chloride: 99 mmol/L (ref 98–111)
Creatinine, Ser: 1.1 mg/dL (ref 0.61–1.24)
GFR, Estimated: >60 mL/min (ref >60)
Glucose, Bld: 104 mg/dL — ABNORMAL HIGH (ref 70–99)
Potassium: 4.1 mmol/L (ref 3.5–5.1)
Sodium: 131 mmol/L — ABNORMAL LOW (ref 135–145)
Total Bilirubin: 1.9 mg/dL — ABNORMAL HIGH (ref 0.0–1.2)
Total Protein: 7 g/dL (ref 6.5–8.1)

## 2024-05-30 LAB — IRON AND TIBC
Iron: 31 ug/dL — ABNORMAL LOW (ref 45–182)
Saturation Ratios: 7 % — ABNORMAL LOW (ref 17.9–39.5)
TIBC: 452 ug/dL — ABNORMAL HIGH (ref 250–450)
UIBC: 421 ug/dL

## 2024-05-30 LAB — CBC WITH DIFFERENTIAL/PLATELET
Abs Immature Granulocytes: 0 K/uL (ref 0.00–0.07)
Basophils Absolute: 0.1 K/uL (ref 0.0–0.1)
Basophils Relative: 2 %
Eosinophils Absolute: 0.3 K/uL (ref 0.0–0.5)
Eosinophils Relative: 11 %
HCT: 28.5 % — ABNORMAL LOW (ref 39.0–52.0)
Hemoglobin: 9.3 g/dL — ABNORMAL LOW (ref 13.0–17.0)
Immature Granulocytes: 0 %
Lymphocytes Relative: 27 %
Lymphs Abs: 0.7 K/uL (ref 0.7–4.0)
MCH: 27.7 pg (ref 26.0–34.0)
MCHC: 32.6 g/dL (ref 30.0–36.0)
MCV: 84.8 fL (ref 80.0–100.0)
Monocytes Absolute: 0.3 K/uL (ref 0.1–1.0)
Monocytes Relative: 11 %
Neutro Abs: 1.4 K/uL — ABNORMAL LOW (ref 1.7–7.7)
Neutrophils Relative %: 49 %
Platelets: 188 K/uL (ref 150–400)
RBC: 3.36 MIL/uL — ABNORMAL LOW (ref 4.22–5.81)
RDW: 14 % (ref 11.5–15.5)
WBC: 2.8 K/uL — ABNORMAL LOW (ref 4.0–10.5)
nRBC: 0 % (ref 0.0–0.2)

## 2024-05-30 LAB — FERRITIN: Ferritin: 34 ng/mL (ref 24–336)

## 2024-05-31 ENCOUNTER — Other Ambulatory Visit (HOSPITAL_BASED_OUTPATIENT_CLINIC_OR_DEPARTMENT_OTHER): Payer: Self-pay

## 2024-05-31 ENCOUNTER — Other Ambulatory Visit: Payer: Self-pay

## 2024-05-31 LAB — VITAMIN B12: Vitamin B-12: 741 pg/mL (ref 232–1245)

## 2024-05-31 LAB — FOLATE: Folate: 7.5 ng/mL (ref >3.0)

## 2024-05-31 LAB — AFP TUMOR MARKER: AFP, Serum, Tumor Marker: <1.8 ng/mL (ref 0.0–6.9)

## 2024-05-31 MED ORDER — PANTOPRAZOLE SODIUM 40 MG PO TBEC
40.0000 mg | DELAYED_RELEASE_TABLET | Freq: Every day | ORAL | 2 refills | Status: DC
  Filled 2024-05-31: qty 30, 30d supply, fill #0
  Filled 2024-07-03: qty 30, 30d supply, fill #1
  Filled 2024-07-24 – 2024-07-27 (×4): qty 30, 30d supply, fill #2

## 2024-06-01 ENCOUNTER — Ambulatory Visit: Payer: 59 | Admitting: Oncology

## 2024-06-04 ENCOUNTER — Ambulatory Visit (INDEPENDENT_AMBULATORY_CARE_PROVIDER_SITE_OTHER): Payer: Self-pay | Admitting: Gastroenterology

## 2024-06-04 NOTE — Progress Notes (Signed)
 aFP less than 1.8 Vitamin B12 741 Ferritin 7.5

## 2024-06-06 ENCOUNTER — Encounter: Payer: Self-pay | Admitting: Oncology

## 2024-06-07 NOTE — Assessment & Plan Note (Addendum)
 Leukopenia likely secondary to cirrhosis.  Stable at this time with a WBC of 2.8.  -Continue to monitor -Advised patient to monitor for fevers and go to the ER if it is more than 100.19F

## 2024-06-07 NOTE — Assessment & Plan Note (Addendum)
 Elevated liver enzymes and bilirubin on recent labs.  Ultrasound of with evidence of liver cirrhosis and splenomegaly - Continue to follow with Dr. Cinderella with GI

## 2024-06-07 NOTE — Assessment & Plan Note (Addendum)
 Iron  deficiency anemia likely secondary to GI bleed and liver cirrhosis Previously improved with IV iron  and oral iron  Follows with Dr. Cinderella.  Recent endoscopy showed portal hypertensive gastropathy.  -Will administer 1 more dose of IV iron .  Reemphasized side effects including allergic reactions, nausea and headaches.  Patient is in agreement - Start taking oral iron  every other day. - Please follow-up with Dr. Cinderella for possible repeat endoscopy.  Return to clinic in 3 months with labs.

## 2024-06-07 NOTE — Assessment & Plan Note (Addendum)
No current symptoms reported with pantoprazole. -Continue pantoprazole as prescribed.

## 2024-06-07 NOTE — Progress Notes (Signed)
 [facility] at [facility]  HEMATOLOGY FOLLOW-UP VISIT  Antonio Rush, MD  REASON FOR FOLLOW-UP: Iron  deficiency anemia  ASSESSMENT & PLAN:  Patient is a 48 y.o. male  with past medical history of alcohol use, primary biliary cirrhosis and peptic ulcer disease following for iron  deficiency anemia   Assessment & Plan Iron  deficiency anemia due to chronic blood loss Iron  deficiency anemia likely secondary to GI bleed and liver cirrhosis Previously improved with IV iron  and oral iron  Follows with Dr. Cinderella.  Recent endoscopy showed portal hypertensive gastropathy.  -Will administer 1 more dose of IV iron .  Reemphasized side effects including allergic reactions, nausea and headaches.  Patient is in agreement - Start taking oral iron  every other day. - Please follow-up with Dr. Cinderella for possible repeat endoscopy.  Return to clinic in 3 months with labs. Other neutropenia (HCC) Leukopenia likely secondary to cirrhosis.  Stable at this time with a WBC of 2.8.  -Continue to monitor -Advised patient to monitor for fevers and go to the ER if it is more than 100.42F Peptic ulcer disease No current symptoms reported with pantoprazole . -Continue pantoprazole  as prescribed. Primary biliary cirrhosis (HCC) Elevated liver enzymes and bilirubin on recent labs.  Ultrasound of with evidence of liver cirrhosis and splenomegaly - Continue to follow with Dr. Cinderella with GI Alcoholic cirrhosis of liver without ascites (HCC) No alcohol consumption reported in the past 5 months. No withdrawal symptoms.  -Continue alcohol abstinence. -Continue to follow with GI and take beta-blocker for varices.  Orders Placed This Encounter  Procedures   Ferritin    Standing Status:   Future    Expected Date:   09/05/2024    Expiration Date:   12/04/2024   CBC with Differential/Platelet    Standing Status:   Future    Expected Date:   09/05/2024    Expiration Date:   12/04/2024    Comprehensive metabolic panel with GFR    Standing Status:   Future    Expected Date:   09/05/2024    Expiration Date:   12/04/2024   Iron  and TIBC    Standing Status:   Future    Expected Date:   09/05/2024    Expiration Date:   12/04/2024    The total time spent in the appointment was 20 minutes encounter with patients including review of chart and various tests results, discussions about plan of care and coordination of care plan   All questions were answered. The patient knows to call the clinic with any problems, questions or concerns. No barriers to learning was detected.   LILLETTE Verneta SAUNDERS Teague,acting as a Neurosurgeon for Mickiel Dry, MD.,have documented all relevant documentation on the behalf of Mickiel Dry, MD,as directed by  Mickiel Dry, MD while in the presence of Mickiel Dry, MD.  I, Mickiel Dry MD, have reviewed the above documentation for accuracy and completeness, and I agree with the above.     Mickiel Dry, MD 8/23/202510:25 AM   SUMMARY OF HEMATOLOGIC HISTORY: Patient has anemia secondary to iron  deficiency, chronic blood loss due to PUD and a competent of chronic disease secondary to cirrhosis -S/p IV Venofer  400 mg X 4 doses   INTERVAL HISTORY: Antonio Allen 48 y.o. male with past medical history of alcohol use, primary biliary cirrhosis, PUD iron  deficiency following up for iron  deficiency anemia.  He is accompanied by his daughter today.  Patient reports feeling well with occasional fatigue.  Denies active blood in stools,  melena, weight loss or loss of appetite.  He has remained abstinent from alcohol.  He has no complaints today.  I have reviewed the past medical history, past surgical history, social history and family history with the patient   ALLERGIES:  is allergic to bee venom.  MEDICATIONS:  Current Outpatient Medications  Medication Sig Dispense Refill   aspirin EC 81 MG tablet Take 81 mg by mouth.     carvedilol  (COREG ) 3.125 MG  tablet Take 2 tablets (6.25 mg total) by mouth 2 (two) times daily with a meal. 120 tablet 11   Cholecalciferol (VITAMIN D -1000 MAX ST) 25 MCG (1000 UT) tablet Take 1,000 Units by mouth daily.     furosemide  (LASIX ) 20 MG tablet Take 1 tablet (20 mg total) by mouth daily. 30 tablet 11   hydrOXYzine  (ATARAX ) 25 MG tablet Take 25 mg by mouth every 6 (six) hours.     LORazepam  (ATIVAN ) 0.5 MG tablet Take 1 tablet (0.5 mg total) by mouth 2 (two) times daily as needed 60 tablet 0   ondansetron  (ZOFRAN ) 4 MG tablet Take 1 tablet (4 mg total) by mouth every 8 (eight) hours as needed for nausea or vomiting. 30 tablet 1   pantoprazole  (PROTONIX ) 40 MG tablet Take 1 tablet (40 mg total) by mouth daily. 30 tablet 2   senna-docusate (SENOKOT-S) 8.6-50 MG tablet Take 1 tablet by mouth daily.     spironolactone  (ALDACTONE ) 50 MG tablet Take 1 tablet (50 mg total) by mouth daily. 30 tablet 5   tiZANidine  (ZANAFLEX ) 4 MG tablet Take 4 mg by mouth every 6 (six) hours.     traMADol  (ULTRAM ) 50 MG tablet Take 1 tablet (50 mg total) by mouth every 6 (six) hours as needed. 60 tablet 0   ursodiol  (URSO  FORTE) 500 MG tablet Take 1 tablet (500 mg total) by mouth 3 (three) times daily. 90 tablet 5   tiZANidine  (ZANAFLEX ) 4 MG tablet Take 1 tablet (4 mg total) by mouth every 6 (six) hours. 56 tablet 1   traMADol  (ULTRAM ) 50 MG tablet Take 1 tablet (50 mg total) by mouth every 6 (six) hours as needed. 60 tablet 1   No current facility-administered medications for this visit.     REVIEW OF SYSTEMS:   Constitutional: Denies fevers, chills or night sweats Eyes: Denies blurriness of vision Ears, nose, mouth, throat, and face: Denies mucositis or sore throat Respiratory: Denies cough, dyspnea or wheezes Cardiovascular: Denies palpitation, chest discomfort or lower extremity swelling Gastrointestinal:  Denies nausea, heartburn or change in bowel habits Skin: Denies abnormal skin rashes Lymphatics: Denies new  lymphadenopathy or easy bruising Neurological:Denies numbness, tingling or new weaknesses Behavioral/Psych: Mood is stable, no new changes  All other systems were reviewed with the patient and are negative.  PHYSICAL EXAMINATION:   Vitals:   06/08/24 1032  BP: 135/66  Pulse: 75  Resp: 16  Temp: 98 F (36.7 C)  SpO2: 100%   GENERAL:alert, no distress and comfortable LUNGS: clear to auscultation and percussion with normal breathing effort HEART: regular rate & rhythm and no murmurs and no lower extremity edema ABDOMEN:abdomen soft, non-tender and normal bowel sounds Musculoskeletal:no cyanosis of digits and no clubbing  NEURO: alert & oriented x 3 with fluent speech  LABORATORY DATA:  I have reviewed the data as listed labs below  Lab Results  Component Value Date   WBC 2.8 (L) 05/30/2024   NEUTROABS 1.4 (L) 05/30/2024   HGB 9.3 (L) 05/30/2024   HCT 28.5 (  L) 05/30/2024   MCV 84.8 05/30/2024   PLT 188 05/30/2024       Chemistry      Component Value Date/Time   NA 131 (L) 05/30/2024 0949   NA 141 12/20/2023 0925   K 4.1 05/30/2024 0949   CL 99 05/30/2024 0949   CO2 22 05/30/2024 0949   BUN 17 05/30/2024 0949   BUN 9 12/20/2023 0925   CREATININE 1.10 05/30/2024 0949   CREATININE 0.90 06/21/2023 1000      Component Value Date/Time   CALCIUM 9.5 05/30/2024 0949   ALKPHOS 231 (H) 05/30/2024 0949   AST 29 05/30/2024 0949   ALT 17 05/30/2024 0949   BILITOT 1.9 (H) 05/30/2024 0949   BILITOT 2.7 (H) 12/20/2023 0925      Latest Reference Range & Units 05/30/24 09:49 05/30/24 12:17  Iron  45 - 182 ug/dL 31 (L)   UIBC ug/dL 578   TIBC 749 - 549 ug/dL 547 (H)   Saturation Ratios 17.9 - 39.5 % 7 (L)   Ferritin 24 - 336 ng/mL 34   Folate >3.0 ng/mL  7.5  Vitamin B12 232 - 1,245 pg/mL  741  (L): Data is abnormally low (H): Data is abnormally high   Latest Reference Range & Units 05/30/24 12:17  AFP, Serum, Tumor Marker 0.0 - 6.9 ng/mL <1.8   Endoscopy:  11/24/2023: Impression:  - Portal hypertensive gastropathy.  - Normal duodenal bulb and second portion of the duodenum.  - Successful completion of the Video Capsule Enteroscope placement.  - No specimens collected.  - Note previously seen bleeding area and ulcer have healed but this exam appears to beworse than previous one in terms of progression of portal hypertensive gastropathy  - IDA can be explained by PHG but given profound weight loss and initially with profound exam video capsule was deployed to examine small bowel  - Also note that while deploying the capsule there was contact and trauma to the antrum and small amount of self- limiting oozing hence blood in first part of duodenum may be seen in video capsule

## 2024-06-07 NOTE — Assessment & Plan Note (Addendum)
 No alcohol consumption reported in the past 5 months. No withdrawal symptoms. -Continue alcohol abstinence. -Continue to follow with GI and take beta-blocker for varices.

## 2024-06-08 ENCOUNTER — Inpatient Hospital Stay (HOSPITAL_BASED_OUTPATIENT_CLINIC_OR_DEPARTMENT_OTHER): Admitting: Oncology

## 2024-06-08 ENCOUNTER — Other Ambulatory Visit (HOSPITAL_BASED_OUTPATIENT_CLINIC_OR_DEPARTMENT_OTHER): Payer: Self-pay

## 2024-06-08 ENCOUNTER — Encounter: Payer: Self-pay | Admitting: Oncology

## 2024-06-08 VITALS — BP 135/66 | HR 75 | Temp 98.0°F | Resp 16 | Wt 231.0 lb

## 2024-06-08 DIAGNOSIS — K743 Primary biliary cirrhosis: Secondary | ICD-10-CM | POA: Diagnosis not present

## 2024-06-08 DIAGNOSIS — D708 Other neutropenia: Secondary | ICD-10-CM

## 2024-06-08 DIAGNOSIS — D500 Iron deficiency anemia secondary to blood loss (chronic): Secondary | ICD-10-CM

## 2024-06-08 DIAGNOSIS — K7030 Alcoholic cirrhosis of liver without ascites: Secondary | ICD-10-CM

## 2024-06-08 DIAGNOSIS — K279 Peptic ulcer, site unspecified, unspecified as acute or chronic, without hemorrhage or perforation: Secondary | ICD-10-CM

## 2024-06-08 MED ORDER — TIZANIDINE HCL 4 MG PO TABS
4.0000 mg | ORAL_TABLET | Freq: Four times a day (QID) | ORAL | 1 refills | Status: DC
  Filled 2024-06-08: qty 56, 14d supply, fill #0
  Filled 2024-07-03: qty 56, 14d supply, fill #1

## 2024-06-08 MED ORDER — TRAMADOL HCL 50 MG PO TABS
50.0000 mg | ORAL_TABLET | Freq: Four times a day (QID) | ORAL | 1 refills | Status: DC | PRN
  Filled 2024-06-08: qty 60, 15d supply, fill #0
  Filled 2024-07-26: qty 60, 15d supply, fill #1

## 2024-06-09 ENCOUNTER — Encounter: Payer: Self-pay | Admitting: Oncology

## 2024-06-11 ENCOUNTER — Inpatient Hospital Stay

## 2024-06-11 VITALS — BP 144/85 | HR 61 | Temp 96.8°F | Resp 18

## 2024-06-11 DIAGNOSIS — D500 Iron deficiency anemia secondary to blood loss (chronic): Secondary | ICD-10-CM | POA: Diagnosis not present

## 2024-06-11 MED ORDER — SODIUM CHLORIDE 0.9 % IV SOLN: INTRAVENOUS | Status: DC

## 2024-06-11 MED ORDER — IRON SUCROSE 500 MG IVPB - SIMPLE MED
500.0000 mg | Freq: Once | INTRAVENOUS | Status: AC
  Administered 2024-06-11: 500 mg via INTRAVENOUS
  Filled 2024-06-11: qty 500

## 2024-06-11 NOTE — Progress Notes (Signed)
Venofer given today per MD orders. Tolerated infusion without adverse affects. Vital signs stable. No complaints at this time. Discharged from clinic ambulatory in stable condition. Alert and oriented x 3. F/U with [facility] as scheduled.

## 2024-06-11 NOTE — Patient Instructions (Signed)
 [facility] [place] - A DEPT OF [facility]  Discharge Instructions: Thank you for choosing [facility] to provide your oncology and hematology care.  If you have a lab appointment with the Cancer Center - please note that after April 8th, 2024, all labs will be drawn in the cancer center.  You do not have to check in or register with the main entrance as you have in the past but will complete your check-in in the cancer center.  Wear comfortable clothing and clothing appropriate for easy access to any Portacath or PICC line.   We strive to give you quality time with your provider. You may need to reschedule your appointment if you arrive late (15 or more minutes).  Arriving late affects you and other patients whose appointments are after yours.  Also, if you miss three or more appointments without notifying the office, you may be dismissed from the clinic at the provider's discretion.      For prescription refill requests, have your pharmacy contact our office and allow 72 hours for refills to be completed.    Today you received the following chemotherapy and/or immunotherapy agents Venofer. Iron Sucrose Injection What is this medication? IRON SUCROSE (EYE ern SOO krose) treats low levels of iron (iron deficiency anemia) in people with kidney disease. Iron is a mineral that plays an important role in making red blood cells, which carry oxygen from your lungs to the rest of your body. This medicine may be used for other purposes; ask your health care provider or pharmacist if you have questions. COMMON BRAND NAME(S): Venofer What should I tell my care team before I take this medication? They need to know if you have any of these conditions: Anemia not caused by low iron levels Heart disease High levels of iron in the blood Kidney disease Liver disease An unusual or allergic reaction to iron, other medications, foods, dyes, or preservatives Pregnant or  trying to get pregnant Breastfeeding How should I use this medication? This medication is infused into a vein. It is given by your care team in a hospital or clinic setting. Talk to your care team about the use of this medication in children. While it may be prescribed for children as young as 2 years for selected conditions, precautions do apply. Overdosage: If you think you have taken too much of this medicine contact a poison control center or emergency room at once. NOTE: This medicine is only for you. Do not share this medicine with others. What if I miss a dose? Keep appointments for follow-up doses. It is important not to miss your dose. Call your care team if you are unable to keep an appointment. What may interact with this medication? Do not take this medication with any of the following: Deferoxamine Dimercaprol Other iron products This medication may also interact with the following: Chloramphenicol Deferasirox This list may not describe all possible interactions. Give your health care provider a list of all the medicines, herbs, non-prescription drugs, or dietary supplements you use. Also tell them if you smoke, drink alcohol, or use illegal drugs. Some items may interact with your medicine. What should I watch for while using this medication? Visit your care team for regular checks on your progress. Tell your care team if your symptoms do not start to get better or if they get worse. You may need blood work done while you are taking this medication. You may need to eat  more foods that contain iron. Talk to your care team. Foods that contain iron include whole grains or cereals, dried fruits, beans, peas, leafy green vegetables, and organ meats (liver, kidney). What side effects may I notice from receiving this medication? Side effects that you should report to your care team as soon as possible: Allergic reactions--skin rash, itching, hives, swelling of the face, lips, tongue,  or throat Low blood pressure--dizziness, feeling faint or lightheaded, blurry vision Shortness of breath Side effects that usually do not require medical attention (report to your care team if they continue or are bothersome): Flushing Headache Joint pain Muscle pain Nausea Pain, redness, or irritation at injection site This list may not describe all possible side effects. Call your doctor for medical advice about side effects. You may report side effects to FDA at 1-800-FDA-1088. Where should I keep my medication? This medication is given in a hospital or clinic. It will not be stored at home. NOTE: This sheet is a summary. It may not cover all possible information. If you have questions about this medicine, talk to your doctor, pharmacist, or health care provider.  2024 Elsevier/Gold Standard (2023-05-25 00:00:00)      To help prevent nausea and vomiting after your treatment, we encourage you to take your nausea medication as directed.  BELOW ARE SYMPTOMS THAT SHOULD BE REPORTED IMMEDIATELY: *FEVER GREATER THAN 100.4 F (38 C) OR HIGHER *CHILLS OR SWEATING *NAUSEA AND VOMITING THAT IS NOT CONTROLLED WITH YOUR NAUSEA MEDICATION *UNUSUAL SHORTNESS OF BREATH *UNUSUAL BRUISING OR BLEEDING *URINARY PROBLEMS (pain or burning when urinating, or frequent urination) *BOWEL PROBLEMS (unusual diarrhea, constipation, pain near the anus) TENDERNESS IN MOUTH AND THROAT WITH OR WITHOUT PRESENCE OF ULCERS (sore throat, sores in mouth, or a toothache) UNUSUAL RASH, SWELLING OR PAIN  UNUSUAL VAGINAL DISCHARGE OR ITCHING   Items with * indicate a potential emergency and should be followed up as soon as possible or go to the Emergency Department if any problems should occur.  Please show the CHEMOTHERAPY ALERT CARD or IMMUNOTHERAPY ALERT CARD at check-in to the Emergency Department and triage nurse.  Should you have questions after your visit or need to cancel or reschedule your appointment, please  contact [facility] [place] - A DEPT OF [facility] [phone]  and follow the prompts.  Office hours are 8:00 a.m. to 4:30 p.m. Monday - Friday. Please note that voicemails left after 4:00 p.m. may not be returned until the following business day.  We are closed weekends and major holidays. You have access to a nurse at all times for urgent questions. Please call the main number to the clinic [phone] and follow the prompts.  For any non-urgent questions, you may also contact your provider using MyChart. We now offer e-Visits for anyone 70 and older to request care online for non-urgent symptoms. For details visit [email or web address].   Also download the MyChart app! Go to the app store, search "MyChart", open the app, select [place], and log in with your MyChart username and password.

## 2024-06-11 NOTE — Progress Notes (Signed)
 Patient presents today for iron  infusion.  Patient is in satisfactory condition with no new complaints voiced.  Vital signs are stable.  IV placed in L wrist.  IV flushed well with good blood return noted.  Tylenol  and Benadryl  taken at 1030 prior to visit.  We will proceed with infusion per provider orders.

## 2024-06-19 ENCOUNTER — Inpatient Hospital Stay

## 2024-06-20 ENCOUNTER — Inpatient Hospital Stay

## 2024-06-25 ENCOUNTER — Inpatient Hospital Stay: Attending: Oncology

## 2024-06-25 VITALS — BP 135/73 | HR 64 | Temp 97.3°F | Resp 18

## 2024-06-25 DIAGNOSIS — D708 Other neutropenia: Secondary | ICD-10-CM | POA: Diagnosis not present

## 2024-06-25 DIAGNOSIS — K7030 Alcoholic cirrhosis of liver without ascites: Secondary | ICD-10-CM | POA: Insufficient documentation

## 2024-06-25 DIAGNOSIS — Z79899 Other long term (current) drug therapy: Secondary | ICD-10-CM | POA: Diagnosis not present

## 2024-06-25 DIAGNOSIS — K743 Primary biliary cirrhosis: Secondary | ICD-10-CM | POA: Diagnosis not present

## 2024-06-25 DIAGNOSIS — D509 Iron deficiency anemia, unspecified: Secondary | ICD-10-CM | POA: Insufficient documentation

## 2024-06-25 DIAGNOSIS — K279 Peptic ulcer, site unspecified, unspecified as acute or chronic, without hemorrhage or perforation: Secondary | ICD-10-CM | POA: Diagnosis not present

## 2024-06-25 DIAGNOSIS — D500 Iron deficiency anemia secondary to blood loss (chronic): Secondary | ICD-10-CM

## 2024-06-25 MED ORDER — IRON SUCROSE 500 MG IVPB - SIMPLE MED
500.0000 mg | Freq: Once | INTRAVENOUS | Status: AC
  Administered 2024-06-25: 500 mg via INTRAVENOUS
  Filled 2024-06-25: qty 100

## 2024-06-25 MED ORDER — SODIUM CHLORIDE 0.9 % IV SOLN: INTRAVENOUS | Status: DC

## 2024-06-25 NOTE — Patient Instructions (Signed)
 [facility] [place] - A DEPT OF [facility]  Discharge Instructions: Thank you for choosing [facility] to provide your oncology and hematology care.  If you have a lab appointment with the Cancer Center - please note that after April 8th, 2024, all labs will be drawn in the cancer center.  You do not have to check in or register with the main entrance as you have in the past but will complete your check-in in the cancer center.  Wear comfortable clothing and clothing appropriate for easy access to any Portacath or PICC line.   We strive to give you quality time with your provider. You may need to reschedule your appointment if you arrive late (15 or more minutes).  Arriving late affects you and other patients whose appointments are after yours.  Also, if you miss three or more appointments without notifying the office, you may be dismissed from the clinic at the provider's discretion.      For prescription refill requests, have your pharmacy contact our office and allow 72 hours for refills to be completed.    Today you received the following iron  infusion: Venofer    To help prevent nausea and vomiting after your treatment, we encourage you to take your nausea medication as directed.  BELOW ARE SYMPTOMS THAT SHOULD BE REPORTED IMMEDIATELY: *FEVER GREATER THAN 100.4 F (38 C) OR HIGHER *CHILLS OR SWEATING *NAUSEA AND VOMITING THAT IS NOT CONTROLLED WITH YOUR NAUSEA MEDICATION *UNUSUAL SHORTNESS OF BREATH *UNUSUAL BRUISING OR BLEEDING *URINARY PROBLEMS (pain or burning when urinating, or frequent urination) *BOWEL PROBLEMS (unusual diarrhea, constipation, pain near the anus) TENDERNESS IN MOUTH AND THROAT WITH OR WITHOUT PRESENCE OF ULCERS (sore throat, sores in mouth, or a toothache) UNUSUAL RASH, SWELLING OR PAIN  UNUSUAL VAGINAL DISCHARGE OR ITCHING   Items with * indicate a potential emergency and should be followed up as soon as possible or go to  the Emergency Department if any problems should occur.  Please show the CHEMOTHERAPY ALERT CARD or IMMUNOTHERAPY ALERT CARD at check-in to the Emergency Department and triage nurse.  Should you have questions after your visit or need to cancel or reschedule your appointment, please contact [facility] [place] - A DEPT OF [facility] [phone]  and follow the prompts.  Office hours are 8:00 a.m. to 4:30 p.m. Monday - Friday. Please note that voicemails left after 4:00 p.m. may not be returned until the following business day.  We are closed weekends and major holidays. You have access to a nurse at all times for urgent questions. Please call the main number to the clinic [phone] and follow the prompts.  For any non-urgent questions, you may also contact your provider using MyChart. We now offer e-Visits for anyone 71 and older to request care online for non-urgent symptoms. For details visit [email or web address].   Also download the MyChart app! Go to the app store, search MyChart, open the app, select , and log in with your MyChart username and password.

## 2024-06-25 NOTE — Progress Notes (Signed)
 Patient took 2 tylenol  and one benadryl   this am at 0930   Venofer  500 ml iron  infusion given per orders. Patient tolerated it well without problems. Vitals stable and discharged home from clinic ambulatory. Follow up as scheduled.

## 2024-06-29 ENCOUNTER — Other Ambulatory Visit: Payer: Self-pay | Admitting: *Deleted

## 2024-06-29 ENCOUNTER — Ambulatory Visit (HOSPITAL_COMMUNITY)
Admission: RE | Admit: 2024-06-29 | Discharge: 2024-06-29 | Disposition: A | Discharge status: Home / Self Care | Source: Ambulatory Visit | Attending: Gastroenterology | Admitting: Gastroenterology

## 2024-06-29 DIAGNOSIS — K7030 Alcoholic cirrhosis of liver without ascites: Secondary | ICD-10-CM | POA: Insufficient documentation

## 2024-06-29 DIAGNOSIS — K769 Liver disease, unspecified: Secondary | ICD-10-CM | POA: Insufficient documentation

## 2024-06-29 DIAGNOSIS — K743 Primary biliary cirrhosis: Secondary | ICD-10-CM | POA: Diagnosis present

## 2024-06-29 MED ORDER — GADOBUTROL 1 MMOL/ML IV SOLN
10.0000 mL | Freq: Once | INTRAVENOUS | Status: AC | PRN
  Administered 2024-06-29: 10 mL via INTRAVENOUS

## 2024-07-03 ENCOUNTER — Other Ambulatory Visit (INDEPENDENT_AMBULATORY_CARE_PROVIDER_SITE_OTHER): Payer: Self-pay | Admitting: Gastroenterology

## 2024-07-03 ENCOUNTER — Other Ambulatory Visit: Payer: Self-pay

## 2024-07-03 ENCOUNTER — Other Ambulatory Visit (HOSPITAL_BASED_OUTPATIENT_CLINIC_OR_DEPARTMENT_OTHER): Payer: Self-pay

## 2024-07-03 DIAGNOSIS — K743 Primary biliary cirrhosis: Secondary | ICD-10-CM

## 2024-07-03 MED ORDER — URSODIOL 500 MG PO TABS
500.0000 mg | ORAL_TABLET | Freq: Three times a day (TID) | ORAL | 5 refills | Status: AC
  Filled 2024-07-03: qty 90, 30d supply, fill #0
  Filled 2024-07-24 – 2024-07-27 (×4): qty 90, 30d supply, fill #1
  Filled 2024-09-26: qty 90, 30d supply, fill #2
  Filled 2024-11-15: qty 90, 30d supply, fill #3

## 2024-07-03 NOTE — Progress Notes (Signed)
  Hi Ann can you place a recall for MRI Abdomen in 6 months ========== HCC Screening and LIRAD 3 lesions  MRI  1. Morphologic changes of the liver compatible with hepatic fibrosis/cirrhosis with sequela of portal hypertension including splenomegaly, abdominal portosystemic collateral vessels and trace ascites. 2. Stable 8 mm observation in the posterior right lobe of the liver which is intrinsically T2 hyperintense and does not demonstrate washout, LI-RADS 3. Recommend follow-up MRI in 6 months. 3. Subtle arterially enhancing 8 mm observation in the anterolateral right lobe of the liver which does not demonstrate washout or capsule and no restricted diffusion previously measuring 6 mm, LI-RADS 3. Recommend attention on follow-up MRI. 4. Diffuse hepatic steatosis.

## 2024-07-04 NOTE — Progress Notes (Signed)
6 mth MRI noted in recall ?

## 2024-07-24 ENCOUNTER — Other Ambulatory Visit (HOSPITAL_COMMUNITY): Payer: Self-pay

## 2024-07-25 ENCOUNTER — Other Ambulatory Visit: Payer: Self-pay

## 2024-07-25 ENCOUNTER — Other Ambulatory Visit (HOSPITAL_COMMUNITY): Payer: Self-pay

## 2024-07-25 ENCOUNTER — Other Ambulatory Visit (HOSPITAL_BASED_OUTPATIENT_CLINIC_OR_DEPARTMENT_OTHER): Payer: Self-pay

## 2024-07-25 MED ORDER — TIZANIDINE HCL 4 MG PO TABS
4.0000 mg | ORAL_TABLET | Freq: Four times a day (QID) | ORAL | 1 refills | Status: AC
  Filled 2024-07-25: qty 56, 14d supply, fill #0
  Filled 2024-08-19: qty 56, 14d supply, fill #1

## 2024-07-27 ENCOUNTER — Other Ambulatory Visit: Payer: Self-pay

## 2024-07-27 ENCOUNTER — Encounter: Payer: Self-pay | Admitting: Oncology

## 2024-07-27 ENCOUNTER — Other Ambulatory Visit (HOSPITAL_BASED_OUTPATIENT_CLINIC_OR_DEPARTMENT_OTHER): Payer: Self-pay

## 2024-07-27 ENCOUNTER — Other Ambulatory Visit (HOSPITAL_COMMUNITY): Payer: Self-pay

## 2024-08-01 ENCOUNTER — Encounter (INDEPENDENT_AMBULATORY_CARE_PROVIDER_SITE_OTHER): Payer: Self-pay | Admitting: Gastroenterology

## 2024-08-19 ENCOUNTER — Other Ambulatory Visit (INDEPENDENT_AMBULATORY_CARE_PROVIDER_SITE_OTHER): Payer: Self-pay | Admitting: Gastroenterology

## 2024-08-20 ENCOUNTER — Other Ambulatory Visit: Payer: Self-pay

## 2024-08-20 ENCOUNTER — Other Ambulatory Visit (HOSPITAL_COMMUNITY): Payer: Self-pay

## 2024-08-20 MED ORDER — PANTOPRAZOLE SODIUM 40 MG PO TBEC
40.0000 mg | DELAYED_RELEASE_TABLET | Freq: Every day | ORAL | 2 refills | Status: DC
  Filled 2024-08-20: qty 30, 30d supply, fill #0
  Filled 2024-09-26: qty 30, 30d supply, fill #1
  Filled 2024-10-31: qty 30, 30d supply, fill #2

## 2024-08-31 ENCOUNTER — Inpatient Hospital Stay: Attending: Oncology

## 2024-08-31 DIAGNOSIS — M546 Pain in thoracic spine: Secondary | ICD-10-CM | POA: Insufficient documentation

## 2024-08-31 DIAGNOSIS — D500 Iron deficiency anemia secondary to blood loss (chronic): Secondary | ICD-10-CM | POA: Insufficient documentation

## 2024-08-31 DIAGNOSIS — K3189 Other diseases of stomach and duodenum: Secondary | ICD-10-CM | POA: Insufficient documentation

## 2024-08-31 DIAGNOSIS — M545 Low back pain, unspecified: Secondary | ICD-10-CM | POA: Insufficient documentation

## 2024-08-31 DIAGNOSIS — D708 Other neutropenia: Secondary | ICD-10-CM | POA: Insufficient documentation

## 2024-08-31 DIAGNOSIS — R5383 Other fatigue: Secondary | ICD-10-CM | POA: Insufficient documentation

## 2024-08-31 DIAGNOSIS — K766 Portal hypertension: Secondary | ICD-10-CM | POA: Insufficient documentation

## 2024-08-31 DIAGNOSIS — Z79899 Other long term (current) drug therapy: Secondary | ICD-10-CM | POA: Insufficient documentation

## 2024-08-31 DIAGNOSIS — Z91030 Bee allergy status: Secondary | ICD-10-CM | POA: Insufficient documentation

## 2024-08-31 DIAGNOSIS — Z8711 Personal history of peptic ulcer disease: Secondary | ICD-10-CM | POA: Insufficient documentation

## 2024-08-31 DIAGNOSIS — K922 Gastrointestinal hemorrhage, unspecified: Secondary | ICD-10-CM | POA: Insufficient documentation

## 2024-08-31 DIAGNOSIS — K7030 Alcoholic cirrhosis of liver without ascites: Secondary | ICD-10-CM | POA: Insufficient documentation

## 2024-08-31 LAB — CBC WITH DIFFERENTIAL/PLATELET
Abs Immature Granulocytes: 0.01 K/uL (ref 0.00–0.07)
Basophils Absolute: 0.1 K/uL (ref 0.0–0.1)
Basophils Relative: 2 %
Eosinophils Absolute: 0.3 K/uL (ref 0.0–0.5)
Eosinophils Relative: 10 %
HCT: 33.7 % — ABNORMAL LOW (ref 39.0–52.0)
Hemoglobin: 11.4 g/dL — ABNORMAL LOW (ref 13.0–17.0)
Immature Granulocytes: 0 %
Lymphocytes Relative: 34 %
Lymphs Abs: 1.1 K/uL (ref 0.7–4.0)
MCH: 31.4 pg (ref 26.0–34.0)
MCHC: 33.8 g/dL (ref 30.0–36.0)
MCV: 92.8 fL (ref 80.0–100.0)
Monocytes Absolute: 0.3 K/uL (ref 0.1–1.0)
Monocytes Relative: 8 %
Neutro Abs: 1.4 K/uL — ABNORMAL LOW (ref 1.7–7.7)
Neutrophils Relative %: 46 %
Platelets: 173 K/uL (ref 150–400)
RBC: 3.63 MIL/uL — ABNORMAL LOW (ref 4.22–5.81)
RDW: 14 % (ref 11.5–15.5)
WBC: 3.1 K/uL — ABNORMAL LOW (ref 4.0–10.5)
nRBC: 0 % (ref 0.0–0.2)

## 2024-08-31 LAB — IRON AND TIBC
Iron: 57 ug/dL (ref 45–182)
Saturation Ratios: 16 % — ABNORMAL LOW (ref 17.9–39.5)
TIBC: 344 ug/dL (ref 250–450)
UIBC: 288 ug/dL

## 2024-08-31 LAB — COMPREHENSIVE METABOLIC PANEL WITH GFR
ALT: 18 U/L (ref 0–44)
AST: 40 U/L (ref 15–41)
Albumin: 4.4 g/dL (ref 3.5–5.0)
Alkaline Phosphatase: 271 U/L — ABNORMAL HIGH (ref 38–126)
Anion gap: 10 (ref 5–15)
BUN: 10 mg/dL (ref 6–20)
CO2: 28 mmol/L (ref 22–32)
Calcium: 9.8 mg/dL (ref 8.9–10.3)
Chloride: 102 mmol/L (ref 98–111)
Creatinine, Ser: 1 mg/dL (ref 0.61–1.24)
GFR, Estimated: >60 mL/min (ref >60)
Glucose, Bld: 88 mg/dL (ref 70–99)
Potassium: 3.9 mmol/L (ref 3.5–5.1)
Sodium: 139 mmol/L (ref 135–145)
Total Bilirubin: 1.6 mg/dL — ABNORMAL HIGH (ref 0.0–1.2)
Total Protein: 7.6 g/dL (ref 6.5–8.1)

## 2024-08-31 LAB — FERRITIN: Ferritin: 114 ng/mL (ref 24–336)

## 2024-09-07 ENCOUNTER — Inpatient Hospital Stay: Admitting: Oncology

## 2024-09-07 ENCOUNTER — Other Ambulatory Visit: Payer: Self-pay | Admitting: Oncology

## 2024-09-07 ENCOUNTER — Ambulatory Visit (HOSPITAL_COMMUNITY)
Admission: RE | Admit: 2024-09-07 | Discharge: 2024-09-07 | Disposition: A | Discharge status: Home / Self Care | Source: Ambulatory Visit | Attending: Oncology | Admitting: Oncology

## 2024-09-07 ENCOUNTER — Ambulatory Visit: Payer: Self-pay | Admitting: Oncology

## 2024-09-07 VITALS — BP 114/75 | HR 56 | Temp 97.8°F | Resp 16 | Wt 232.4 lb

## 2024-09-07 DIAGNOSIS — M546 Pain in thoracic spine: Secondary | ICD-10-CM

## 2024-09-07 DIAGNOSIS — M549 Dorsalgia, unspecified: Secondary | ICD-10-CM | POA: Insufficient documentation

## 2024-09-07 DIAGNOSIS — K769 Liver disease, unspecified: Secondary | ICD-10-CM

## 2024-09-07 DIAGNOSIS — D708 Other neutropenia: Secondary | ICD-10-CM | POA: Diagnosis not present

## 2024-09-07 DIAGNOSIS — D500 Iron deficiency anemia secondary to blood loss (chronic): Secondary | ICD-10-CM | POA: Diagnosis not present

## 2024-09-07 DIAGNOSIS — K7030 Alcoholic cirrhosis of liver without ascites: Secondary | ICD-10-CM

## 2024-09-07 NOTE — Progress Notes (Signed)
 Patient was seen on Friday for iron  deficiency and found to have back pain.  We did imaging of his lower back which showed minimal degenerative disc disease and L2-3 and moderate disease in L5-S1.  Would recommend follow-up with PCP.  This could potentially be causing him some discomfort when he stands for long periods of time.  Delon Hope, AGNP-C Department of Hematology/Oncology [facility] at [facility]  Phone: [phone]  09/07/2024 1:18 PM

## 2024-09-07 NOTE — Assessment & Plan Note (Addendum)
 Recent MRI of liver showed LI-RADS 3 liver lesion. -Being worked up by GI and is planned to get a biopsy done soon.

## 2024-09-07 NOTE — Assessment & Plan Note (Addendum)
 Has been having low back pain for the past 2 months. He has concerns for kidney stones and or musculoskeletal issues. Reports recently picking up a job at [employer] and stands around for 8 to 9 hours on Saturdays. Denies any injury. Discussed x-ray of low back.  Will call with results.

## 2024-09-07 NOTE — Progress Notes (Signed)
 [facility] at [facility]  HEMATOLOGY FOLLOW-UP VISIT  Antonio Rush, MD  REASON FOR FOLLOW-UP: Iron  deficiency anemia  ASSESSMENT & PLAN:  Patient is a 48 y.o. male  with past medical history of alcohol use, primary biliary cirrhosis and peptic ulcer disease following for iron  deficiency anemia   Assessment & Plan Iron  deficiency anemia due to chronic blood loss Iron  deficiency anemia likely secondary to GI bleed and liver cirrhosis Previously improved with IV iron  and oral iron  Follows with Dr. Cinderella.  Recent endoscopy showed portal hypertensive gastropathy. Received 2 doses of IV Venofer  500 mg on 06/11/2024 and 06/25/2024. He has not been taking oral iron . Repeat labs from 08/31/2024 show improvement of his iron  levels but iron  saturations are still low at 16%.  Hemoglobin improved to 11.4 but he is still considered anemic.  -Recommend 2 additional doses of Venofer . - Restart oral iron  every other day. - Follow-up with Dr. Cinderella for repeat endoscopies - Return to clinic in 3 months with labs. Other neutropenia Leukopenia likely secondary to cirrhosis.  Stable at this time with a WBC of 3.1.  -Continue to monitor -Advised patient to monitor for fevers and go to the ER if it is more than 100.23F Liver lesion Recent MRI of liver showed LI-RADS 3 liver lesion. -Being worked up by GI and is planned to get a biopsy done soon. Alcoholic cirrhosis, unspecified whether ascites present (HCC) No alcohol consumption reported in the past 5 months. No withdrawal symptoms.  -Continue alcohol abstinence. -Continue to follow with GI and take beta-blocker for varices. Acute midline thoracic back pain Has been having low back pain for the past 2 months. He has concerns for kidney stones and or musculoskeletal issues. Reports recently picking up a job at [employer] and stands around for 8 to 9 hours on Saturdays. Denies any injury. Discussed x-ray of low back.  Will  call with results.  Orders Placed This Encounter  Procedures   DG Sacrum/Coccyx    Standing Status:   Future    Number of Occurrences:   1    Expected Date:   09/07/2024    Expiration Date:   09/07/2025    Reason for Exam (SYMPTOM  OR DIAGNOSIS REQUIRED):   low back pain    Preferred imaging location?:   [facility]   DG Thoracolumabar Spine    Standing Status:   Future    Number of Occurrences:   1    Expected Date:   09/07/2024    Expiration Date:   09/07/2025    Reason for Exam (SYMPTOM  OR DIAGNOSIS REQUIRED):   low back pain    Preferred imaging location?:   [facility]   Ferritin    Standing Status:   Future    Expected Date:   11/29/2024    Expiration Date:   03/04/2025   CBC with Differential/Platelet    Standing Status:   Future    Expected Date:   11/29/2024    Expiration Date:   03/04/2025   Comprehensive metabolic panel with GFR    Standing Status:   Future    Expected Date:   11/29/2024    Expiration Date:   03/04/2025   Iron  and TIBC    Standing Status:   Future    Expected Date:   11/29/2024    Expiration Date:   03/04/2025    The total time spent in the appointment was 20 minutes encounter with patients including  review of chart and various tests results, discussions about plan of care and coordination of care plan   All questions were answered. The patient knows to call the clinic with any problems, questions or concerns. No barriers to learning was detected.     Antonio FORBES Hope, NP 11/21/202510:20 AM   SUMMARY OF HEMATOLOGIC HISTORY: Patient has anemia secondary to iron  deficiency, chronic blood loss due to PUD and a competent of chronic disease secondary to cirrhosis -S/p IV Venofer  400 mg X 4 doses   INTERVAL HISTORY: Antonio Allen 48 y.o. male with past medical history of alcohol use, primary biliary cirrhosis, PUD iron  deficiency following up for iron  deficiency anemia.  Patient reports feeling well with occasional fatigue.  Denies  active blood in stools, melena, weight loss or loss of appetite.  He has remained abstinent from alcohol.  He has no complaints today.  Patient receives intermittent IV iron .  Last given on 06/11/2024 and 06/25/2024.  Reports he has not been taking iron  tablets.  Reports low back pain that started a few months ago.  Has concerns for possible kidney stone versus musculoskeletal issue.  Reports he recently picked up a job working at [employer] where he is standing 8 to 9 hours on Saturdays.  Thanks this may be contributing.  Denies any injury to his back.  Denies any bright red blood per rectum, melena or hematochezia.  I have reviewed the past medical history, past surgical history, social history and family history with the patient   ALLERGIES:  is allergic to bee venom.  MEDICATIONS:  Current Outpatient Medications  Medication Sig Dispense Refill   aspirin EC 81 MG tablet Take 81 mg by mouth.     carvedilol  (COREG ) 3.125 MG tablet Take 2 tablets (6.25 mg total) by mouth 2 (two) times daily with a meal. 120 tablet 11   Cholecalciferol (VITAMIN D -1000 MAX ST) 25 MCG (1000 UT) tablet Take 1,000 Units by mouth daily.     furosemide  (LASIX ) 20 MG tablet Take 1 tablet (20 mg total) by mouth daily. 30 tablet 11   hydrOXYzine  (ATARAX ) 25 MG tablet Take 25 mg by mouth every 6 (six) hours.     LORazepam  (ATIVAN ) 0.5 MG tablet Take 1 tablet (0.5 mg total) by mouth 2 (two) times daily as needed 60 tablet 0   ondansetron  (ZOFRAN ) 4 MG tablet Take 1 tablet (4 mg total) by mouth every 8 (eight) hours as needed for nausea or vomiting. 30 tablet 1   pantoprazole  (PROTONIX ) 40 MG tablet Take 1 tablet (40 mg total) by mouth daily. 30 tablet 2   senna-docusate (SENOKOT-S) 8.6-50 MG tablet Take 1 tablet by mouth daily.     spironolactone  (ALDACTONE ) 50 MG tablet Take 1 tablet (50 mg total) by mouth daily. 30 tablet 5   tiZANidine  (ZANAFLEX ) 4 MG tablet Take 4 mg by mouth every 6 (six) hours.     tiZANidine   (ZANAFLEX ) 4 MG tablet Take 1 tablet (4 mg total) by mouth every 6 (six) hours. 56 tablet 1   traMADol  (ULTRAM ) 50 MG tablet Take 1 tablet (50 mg total) by mouth every 6 (six) hours as needed. 60 tablet 0   traMADol  (ULTRAM ) 50 MG tablet Take 1 tablet (50 mg total) by mouth every 6 (six) hours as needed. 60 tablet 1   ursodiol  (URSO  FORTE) 500 MG tablet Take 1 tablet (500 mg total) by mouth 3 (three) times daily. 90 tablet 5   No current facility-administered medications for  this visit.     REVIEW OF SYSTEMS:   Review of Systems  Musculoskeletal:  Positive for back pain.     PHYSICAL EXAMINATION:   Vitals:   09/07/24 0940  BP: 114/75  Pulse: (!) 56  Resp: 16  Temp: 97.8 F (36.6 C)  SpO2: 100%    GENERAL:alert, no distress and comfortable LUNGS: clear to auscultation and percussion with normal breathing effort HEART: regular rate & rhythm and no murmurs and no lower extremity edema ABDOMEN:abdomen soft, non-tender and normal bowel sounds Musculoskeletal:no cyanosis of digits and no clubbing  NEURO: alert & oriented x 3 with fluent speech  LABORATORY DATA:  I have reviewed the data as listed labs below  Lab Results  Component Value Date   WBC 3.1 (L) 08/31/2024   NEUTROABS 1.4 (L) 08/31/2024   HGB 11.4 (L) 08/31/2024   HCT 33.7 (L) 08/31/2024   MCV 92.8 08/31/2024   PLT 173 08/31/2024       Chemistry      Component Value Date/Time   NA 139 08/31/2024 1008   NA 141 12/20/2023 0925   K 3.9 08/31/2024 1008   CL 102 08/31/2024 1008   CO2 28 08/31/2024 1008   BUN 10 08/31/2024 1008   BUN 9 12/20/2023 0925   CREATININE 1.00 08/31/2024 1008   CREATININE 0.90 06/21/2023 1000      Component Value Date/Time   CALCIUM 9.8 08/31/2024 1008   ALKPHOS 271 (H) 08/31/2024 1008   AST 40 08/31/2024 1008   ALT 18 08/31/2024 1008   BILITOT 1.6 (H) 08/31/2024 1008   BILITOT 2.7 (H) 12/20/2023 0925      Latest Reference Range & Units 05/30/24 09:49 05/30/24 12:17  Iron   45 - 182 ug/dL 31 (L)   UIBC ug/dL 578   TIBC 749 - 549 ug/dL 547 (H)   Saturation Ratios 17.9 - 39.5 % 7 (L)   Ferritin 24 - 336 ng/mL 34   Folate >3.0 ng/mL  7.5  Vitamin B12 232 - 1,245 pg/mL  741  (L): Data is abnormally low (H): Data is abnormally high   Latest Reference Range & Units 05/30/24 12:17  AFP, Serum, Tumor Marker 0.0 - 6.9 ng/mL <1.8   Endoscopy: 11/24/2023: Impression:  - Portal hypertensive gastropathy.  - Normal duodenal bulb and second portion of the duodenum.  - Successful completion of the Video Capsule Enteroscope placement.  - No specimens collected.  - Note previously seen bleeding area and ulcer have healed but this exam appears to beworse than previous one in terms of progression of portal hypertensive gastropathy  - IDA can be explained by PHG but given profound weight loss and initially with profound exam video capsule was deployed to examine small bowel  - Also note that while deploying the capsule there was contact and trauma to the antrum and small amount of self- limiting oozing hence blood in first part of duodenum may be seen in video capsule  Antonio Allen, AGNP-C Department of Hematology/Oncology [facility] at [facility]  Phone: [phone]  09/07/2024 10:22 AM

## 2024-09-07 NOTE — Assessment & Plan Note (Addendum)
 No alcohol consumption reported in the past 5 months. No withdrawal symptoms. -Continue alcohol abstinence. -Continue to follow with GI and take beta-blocker for varices.

## 2024-09-07 NOTE — Assessment & Plan Note (Addendum)
 Iron  deficiency anemia likely secondary to GI bleed and liver cirrhosis Previously improved with IV iron  and oral iron  Follows with Dr. Cinderella.  Recent endoscopy showed portal hypertensive gastropathy. Received 2 doses of IV Venofer  500 mg on 06/11/2024 and 06/25/2024. He has not been taking oral iron . Repeat labs from 08/31/2024 show improvement of his iron  levels but iron  saturations are still low at 16%.  Hemoglobin improved to 11.4 but he is still considered anemic.  -Recommend 2 additional doses of Venofer . - Restart oral iron  every other day. - Follow-up with Dr. Cinderella for repeat endoscopies - Return to clinic in 3 months with labs.

## 2024-09-07 NOTE — Assessment & Plan Note (Addendum)
 Leukopenia likely secondary to cirrhosis.  Stable at this time with a WBC of 3.1.  -Continue to monitor -Advised patient to monitor for fevers and go to the ER if it is more than 100.69F

## 2024-09-08 ENCOUNTER — Other Ambulatory Visit (HOSPITAL_BASED_OUTPATIENT_CLINIC_OR_DEPARTMENT_OTHER): Payer: Self-pay

## 2024-09-10 NOTE — Progress Notes (Signed)
 Patient made aware and verbalized understanding.

## 2024-09-14 ENCOUNTER — Other Ambulatory Visit (HOSPITAL_BASED_OUTPATIENT_CLINIC_OR_DEPARTMENT_OTHER): Payer: Self-pay

## 2024-09-18 ENCOUNTER — Inpatient Hospital Stay: Attending: Oncology

## 2024-09-18 VITALS — BP 135/72 | HR 53 | Temp 97.5°F | Resp 16

## 2024-09-18 DIAGNOSIS — K769 Liver disease, unspecified: Secondary | ICD-10-CM | POA: Insufficient documentation

## 2024-09-18 DIAGNOSIS — Z79899 Other long term (current) drug therapy: Secondary | ICD-10-CM | POA: Diagnosis not present

## 2024-09-18 DIAGNOSIS — D500 Iron deficiency anemia secondary to blood loss (chronic): Secondary | ICD-10-CM

## 2024-09-18 DIAGNOSIS — K7030 Alcoholic cirrhosis of liver without ascites: Secondary | ICD-10-CM | POA: Diagnosis not present

## 2024-09-18 DIAGNOSIS — D509 Iron deficiency anemia, unspecified: Secondary | ICD-10-CM | POA: Insufficient documentation

## 2024-09-18 DIAGNOSIS — D708 Other neutropenia: Secondary | ICD-10-CM | POA: Insufficient documentation

## 2024-09-18 MED ORDER — SODIUM CHLORIDE 0.9 % IV SOLN: INTRAVENOUS | Status: DC

## 2024-09-18 MED ORDER — IRON SUCROSE 500 MG IVPB - SIMPLE MED
500.0000 mg | Freq: Once | INTRAVENOUS | Status: AC
  Administered 2024-09-18: 500 mg via INTRAVENOUS
  Filled 2024-09-18: qty 100

## 2024-09-18 NOTE — Patient Instructions (Signed)
 [facility] [place] - A DEPT OF [facility]  Discharge Instructions: Thank you for choosing [facility] to provide your oncology and hematology care.  If you have a lab appointment with the Cancer Center - please note that after April 8th, 2024, all labs will be drawn in the cancer center.  You do not have to check in or register with the main entrance as you have in the past but will complete your check-in in the cancer center.  Wear comfortable clothing and clothing appropriate for easy access to any Portacath or PICC line.   We strive to give you quality time with your provider. You may need to reschedule your appointment if you arrive late (15 or more minutes).  Arriving late affects you and other patients whose appointments are after yours.  Also, if you miss three or more appointments without notifying the office, you may be dismissed from the clinic at the provider's discretion.      For prescription refill requests, have your pharmacy contact our office and allow 72 hours for refills to be completed.    Today you received the following iron  infusion, Venofer  500mg    To help prevent nausea and vomiting after your treatment, we encourage you to take your nausea medication as directed.  BELOW ARE SYMPTOMS THAT SHOULD BE REPORTED IMMEDIATELY: *FEVER GREATER THAN 100.4 F (38 C) OR HIGHER *CHILLS OR SWEATING *NAUSEA AND VOMITING THAT IS NOT CONTROLLED WITH YOUR NAUSEA MEDICATION *UNUSUAL SHORTNESS OF BREATH *UNUSUAL BRUISING OR BLEEDING *URINARY PROBLEMS (pain or burning when urinating, or frequent urination) *BOWEL PROBLEMS (unusual diarrhea, constipation, pain near the anus) TENDERNESS IN MOUTH AND THROAT WITH OR WITHOUT PRESENCE OF ULCERS (sore throat, sores in mouth, or a toothache) UNUSUAL RASH, SWELLING OR PAIN  UNUSUAL VAGINAL DISCHARGE OR ITCHING   Items with * indicate a potential emergency and should be followed up as soon as possible or  go to the Emergency Department if any problems should occur.  Please show the CHEMOTHERAPY ALERT CARD or IMMUNOTHERAPY ALERT CARD at check-in to the Emergency Department and triage nurse.  Should you have questions after your visit or need to cancel or reschedule your appointment, please contact [facility] [place] - A DEPT OF [facility] [phone]  and follow the prompts.  Office hours are 8:00 a.m. to 4:30 p.m. Monday - Friday. Please note that voicemails left after 4:00 p.m. may not be returned until the following business day.  We are closed weekends and major holidays. You have access to a nurse at all times for urgent questions. Please call the main number to the clinic [phone] and follow the prompts.  For any non-urgent questions, you may also contact your provider using MyChart. We now offer e-Visits for anyone 41 and older to request care online for non-urgent symptoms. For details visit [email or web address].   Also download the MyChart app! Go to the app store, search MyChart, open the app, select [place], and log in with your MyChart username and password.

## 2024-09-18 NOTE — Progress Notes (Signed)
 Patient took his own pre-meds at home this morning.    Venofer  500 mg iron  infusion given per orders. Patient tolerated it well without problems. Vitals stable and discharged home from clinic ambulatory. Follow up as scheduled.

## 2024-09-26 ENCOUNTER — Other Ambulatory Visit: Payer: Self-pay

## 2024-09-27 ENCOUNTER — Encounter: Payer: Self-pay | Admitting: Oncology

## 2024-09-27 ENCOUNTER — Other Ambulatory Visit (HOSPITAL_BASED_OUTPATIENT_CLINIC_OR_DEPARTMENT_OTHER): Payer: Self-pay

## 2024-09-28 ENCOUNTER — Other Ambulatory Visit (HOSPITAL_BASED_OUTPATIENT_CLINIC_OR_DEPARTMENT_OTHER): Payer: Self-pay

## 2024-09-29 ENCOUNTER — Other Ambulatory Visit (HOSPITAL_BASED_OUTPATIENT_CLINIC_OR_DEPARTMENT_OTHER): Payer: Self-pay

## 2024-09-29 ENCOUNTER — Encounter (HOSPITAL_BASED_OUTPATIENT_CLINIC_OR_DEPARTMENT_OTHER): Payer: Self-pay

## 2024-09-30 ENCOUNTER — Other Ambulatory Visit (HOSPITAL_BASED_OUTPATIENT_CLINIC_OR_DEPARTMENT_OTHER): Payer: Self-pay

## 2024-10-02 ENCOUNTER — Encounter: Payer: Self-pay | Admitting: Oncology

## 2024-10-02 ENCOUNTER — Other Ambulatory Visit (HOSPITAL_BASED_OUTPATIENT_CLINIC_OR_DEPARTMENT_OTHER): Payer: Self-pay

## 2024-10-02 MED ORDER — TRAMADOL HCL 50 MG PO TABS
50.0000 mg | ORAL_TABLET | Freq: Two times a day (BID) | ORAL | 2 refills | Status: AC
  Filled 2024-10-02: qty 60, 30d supply, fill #0
  Filled 2024-11-22: qty 60, 30d supply, fill #1

## 2024-10-15 ENCOUNTER — Encounter: Payer: Self-pay | Admitting: *Deleted

## 2024-10-31 ENCOUNTER — Other Ambulatory Visit: Payer: Self-pay

## 2024-10-31 ENCOUNTER — Other Ambulatory Visit (HOSPITAL_BASED_OUTPATIENT_CLINIC_OR_DEPARTMENT_OTHER): Payer: Self-pay

## 2024-11-01 ENCOUNTER — Encounter (INDEPENDENT_AMBULATORY_CARE_PROVIDER_SITE_OTHER): Payer: Self-pay | Admitting: Gastroenterology

## 2024-11-13 ENCOUNTER — Other Ambulatory Visit (HOSPITAL_BASED_OUTPATIENT_CLINIC_OR_DEPARTMENT_OTHER): Payer: Self-pay

## 2024-11-13 MED ORDER — TIZANIDINE HCL 4 MG PO TABS
4.0000 mg | ORAL_TABLET | Freq: Every day | ORAL | 2 refills | Status: AC
  Filled 2024-11-13: qty 54, 54d supply, fill #0

## 2024-11-15 ENCOUNTER — Other Ambulatory Visit (INDEPENDENT_AMBULATORY_CARE_PROVIDER_SITE_OTHER): Payer: Self-pay | Admitting: Gastroenterology

## 2024-11-16 ENCOUNTER — Other Ambulatory Visit: Payer: Self-pay

## 2024-11-16 ENCOUNTER — Other Ambulatory Visit (HOSPITAL_COMMUNITY): Payer: Self-pay

## 2024-11-16 MED ORDER — PANTOPRAZOLE SODIUM 40 MG PO TBEC
40.0000 mg | DELAYED_RELEASE_TABLET | Freq: Every day | ORAL | 2 refills | Status: AC
  Filled 2024-11-16: qty 30, 30d supply, fill #0

## 2024-11-23 ENCOUNTER — Encounter: Payer: Self-pay | Admitting: Oncology

## 2024-11-23 ENCOUNTER — Other Ambulatory Visit: Payer: Self-pay

## 2024-11-23 ENCOUNTER — Other Ambulatory Visit (HOSPITAL_BASED_OUTPATIENT_CLINIC_OR_DEPARTMENT_OTHER): Payer: Self-pay

## 2024-11-23 MED ORDER — TRAMADOL HCL 50 MG PO TABS
50.0000 mg | ORAL_TABLET | Freq: Four times a day (QID) | ORAL | 1 refills | Status: AC | PRN
  Filled 2024-11-23: qty 60, 15d supply, fill #0

## 2024-12-14 ENCOUNTER — Inpatient Hospital Stay: Attending: Oncology

## 2024-12-21 ENCOUNTER — Inpatient Hospital Stay: Attending: Oncology | Admitting: Oncology
